# Patient Record
Sex: Male | Born: 1935 | Race: White | Hispanic: No | Smoking: Former smoker
Health system: Southern US, Community
[De-identification: ages and names within clinical notes are randomized; demographics above are authoritative.]

## PROBLEM LIST (undated history)

## (undated) DIAGNOSIS — J189 Pneumonia, unspecified organism: Secondary | ICD-10-CM

## (undated) DIAGNOSIS — H9190 Unspecified hearing loss, unspecified ear: Secondary | ICD-10-CM

## (undated) DIAGNOSIS — I4891 Unspecified atrial fibrillation: Secondary | ICD-10-CM

## (undated) DIAGNOSIS — K259 Gastric ulcer, unspecified as acute or chronic, without hemorrhage or perforation: Secondary | ICD-10-CM

## (undated) DIAGNOSIS — Z9981 Dependence on supplemental oxygen: Secondary | ICD-10-CM

## (undated) DIAGNOSIS — J439 Emphysema, unspecified: Secondary | ICD-10-CM

## (undated) DIAGNOSIS — H3530 Unspecified macular degeneration: Secondary | ICD-10-CM

## (undated) DIAGNOSIS — F0390 Unspecified dementia without behavioral disturbance: Secondary | ICD-10-CM

## (undated) DIAGNOSIS — J449 Chronic obstructive pulmonary disease, unspecified: Secondary | ICD-10-CM

## (undated) DIAGNOSIS — R296 Repeated falls: Secondary | ICD-10-CM

## (undated) DIAGNOSIS — N400 Enlarged prostate without lower urinary tract symptoms: Secondary | ICD-10-CM

## (undated) HISTORY — PX: CATARACT EXTRACTION W/ INTRAOCULAR LENS  IMPLANT, BILATERAL: SHX1307

## (undated) HISTORY — DX: Gastric ulcer, unspecified as acute or chronic, without hemorrhage or perforation: K25.9

## (undated) HISTORY — PX: COLONOSCOPY: SHX174

---

## 1999-08-28 ENCOUNTER — Ambulatory Visit (HOSPITAL_COMMUNITY): Admission: RE | Admit: 1999-08-28 | Discharge: 1999-08-28 | Payer: Self-pay | Admitting: Gastroenterology

## 2001-05-13 ENCOUNTER — Encounter: Admission: RE | Admit: 2001-05-13 | Discharge: 2001-08-11 | Payer: Self-pay | Admitting: Family Medicine

## 2001-08-31 ENCOUNTER — Ambulatory Visit (HOSPITAL_COMMUNITY): Admission: RE | Admit: 2001-08-31 | Discharge: 2001-08-31 | Payer: Self-pay | Admitting: Gastroenterology

## 2004-11-21 ENCOUNTER — Inpatient Hospital Stay (HOSPITAL_COMMUNITY): Admission: EM | Admit: 2004-11-21 | Discharge: 2004-11-24 | Payer: Self-pay | Admitting: Emergency Medicine

## 2012-12-25 ENCOUNTER — Ambulatory Visit (INDEPENDENT_AMBULATORY_CARE_PROVIDER_SITE_OTHER): Payer: Medicare Other | Admitting: Family Medicine

## 2012-12-25 VITALS — BP 102/68 | HR 87 | Temp 98.2°F | Resp 18 | Ht 65.0 in | Wt 132.0 lb

## 2012-12-25 DIAGNOSIS — H9190 Unspecified hearing loss, unspecified ear: Secondary | ICD-10-CM

## 2012-12-25 DIAGNOSIS — H9192 Unspecified hearing loss, left ear: Secondary | ICD-10-CM

## 2012-12-25 NOTE — Patient Instructions (Signed)
You can go to Dr Emeline Darling at [facility] on Monday at 1:00.

## 2012-12-25 NOTE — Progress Notes (Signed)
79 yo retired man with left sided loss of hearing, tinnitus, and occasional dizziness/vertigo x 6 months.  The vertigo after the tinnitus has been 3 months.  Tried cleaning left ear with Q-tip which helped x 2 days.  Symptoms have improved.  The "chirping" in his left ear is worse when lying on left ear.  Objective:  Left cerumen impaction Right canal and TM are normal Decreased hearing acuity. Alert with good eye contact. 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

## 2016-10-14 HISTORY — PX: INGUINAL HERNIA REPAIR: SUR1180

## 2016-12-12 DIAGNOSIS — R296 Repeated falls: Secondary | ICD-10-CM

## 2016-12-12 HISTORY — DX: Repeated falls: R29.6

## 2016-12-15 ENCOUNTER — Encounter (HOSPITAL_BASED_OUTPATIENT_CLINIC_OR_DEPARTMENT_OTHER): Payer: Self-pay | Admitting: Emergency Medicine

## 2016-12-15 ENCOUNTER — Inpatient Hospital Stay (HOSPITAL_BASED_OUTPATIENT_CLINIC_OR_DEPARTMENT_OTHER)
Admission: EM | Admit: 2016-12-15 | Discharge: 2016-12-20 | DRG: 872 | Disposition: A | Discharge status: Skilled Nursing Facility | Payer: Medicare Other | Attending: Internal Medicine | Admitting: Internal Medicine

## 2016-12-15 ENCOUNTER — Emergency Department (HOSPITAL_BASED_OUTPATIENT_CLINIC_OR_DEPARTMENT_OTHER): Payer: Medicare Other

## 2016-12-15 DIAGNOSIS — Z79899 Other long term (current) drug therapy: Secondary | ICD-10-CM | POA: Diagnosis not present

## 2016-12-15 DIAGNOSIS — Z515 Encounter for palliative care: Secondary | ICD-10-CM | POA: Diagnosis not present

## 2016-12-15 DIAGNOSIS — F0390 Unspecified dementia without behavioral disturbance: Secondary | ICD-10-CM | POA: Diagnosis present

## 2016-12-15 DIAGNOSIS — M25551 Pain in right hip: Secondary | ICD-10-CM | POA: Diagnosis present

## 2016-12-15 DIAGNOSIS — I10 Essential (primary) hypertension: Secondary | ICD-10-CM | POA: Diagnosis present

## 2016-12-15 DIAGNOSIS — A4151 Sepsis due to Escherichia coli [E. coli]: Secondary | ICD-10-CM | POA: Diagnosis present

## 2016-12-15 DIAGNOSIS — E860 Dehydration: Secondary | ICD-10-CM | POA: Diagnosis present

## 2016-12-15 DIAGNOSIS — A498 Other bacterial infections of unspecified site: Secondary | ICD-10-CM | POA: Diagnosis not present

## 2016-12-15 DIAGNOSIS — R778 Other specified abnormalities of plasma proteins: Secondary | ICD-10-CM | POA: Diagnosis present

## 2016-12-15 DIAGNOSIS — I951 Orthostatic hypotension: Secondary | ICD-10-CM | POA: Diagnosis not present

## 2016-12-15 DIAGNOSIS — T796XXA Traumatic ischemia of muscle, initial encounter: Secondary | ICD-10-CM | POA: Diagnosis present

## 2016-12-15 DIAGNOSIS — M25552 Pain in left hip: Secondary | ICD-10-CM | POA: Diagnosis present

## 2016-12-15 DIAGNOSIS — H9190 Unspecified hearing loss, unspecified ear: Secondary | ICD-10-CM | POA: Diagnosis present

## 2016-12-15 DIAGNOSIS — N400 Enlarged prostate without lower urinary tract symptoms: Secondary | ICD-10-CM | POA: Diagnosis present

## 2016-12-15 DIAGNOSIS — D696 Thrombocytopenia, unspecified: Secondary | ICD-10-CM | POA: Diagnosis present

## 2016-12-15 DIAGNOSIS — D649 Anemia, unspecified: Secondary | ICD-10-CM | POA: Diagnosis present

## 2016-12-15 DIAGNOSIS — R0600 Dyspnea, unspecified: Secondary | ICD-10-CM | POA: Diagnosis not present

## 2016-12-15 DIAGNOSIS — I959 Hypotension, unspecified: Secondary | ICD-10-CM | POA: Diagnosis present

## 2016-12-15 DIAGNOSIS — W1830XA Fall on same level, unspecified, initial encounter: Secondary | ICD-10-CM | POA: Diagnosis present

## 2016-12-15 DIAGNOSIS — N179 Acute kidney failure, unspecified: Secondary | ICD-10-CM | POA: Diagnosis present

## 2016-12-15 DIAGNOSIS — J449 Chronic obstructive pulmonary disease, unspecified: Secondary | ICD-10-CM | POA: Diagnosis present

## 2016-12-15 DIAGNOSIS — I4891 Unspecified atrial fibrillation: Secondary | ICD-10-CM | POA: Diagnosis present

## 2016-12-15 DIAGNOSIS — G3109 Other frontotemporal dementia: Secondary | ICD-10-CM

## 2016-12-15 DIAGNOSIS — R64 Cachexia: Secondary | ICD-10-CM | POA: Diagnosis present

## 2016-12-15 DIAGNOSIS — A419 Sepsis, unspecified organism: Secondary | ICD-10-CM | POA: Diagnosis not present

## 2016-12-15 DIAGNOSIS — K219 Gastro-esophageal reflux disease without esophagitis: Secondary | ICD-10-CM | POA: Diagnosis present

## 2016-12-15 DIAGNOSIS — H548 Legal blindness, as defined in USA: Secondary | ICD-10-CM | POA: Diagnosis present

## 2016-12-15 DIAGNOSIS — Z8711 Personal history of peptic ulcer disease: Secondary | ICD-10-CM

## 2016-12-15 DIAGNOSIS — F0280 Dementia in other diseases classified elsewhere without behavioral disturbance: Secondary | ICD-10-CM | POA: Diagnosis present

## 2016-12-15 DIAGNOSIS — E46 Unspecified protein-calorie malnutrition: Secondary | ICD-10-CM | POA: Diagnosis present

## 2016-12-15 DIAGNOSIS — Z800 Family history of malignant neoplasm of digestive organs: Secondary | ICD-10-CM

## 2016-12-15 DIAGNOSIS — Z66 Do not resuscitate: Secondary | ICD-10-CM | POA: Diagnosis present

## 2016-12-15 DIAGNOSIS — I9589 Other hypotension: Secondary | ICD-10-CM | POA: Diagnosis present

## 2016-12-15 DIAGNOSIS — Y92009 Unspecified place in unspecified non-institutional (private) residence as the place of occurrence of the external cause: Secondary | ICD-10-CM | POA: Diagnosis not present

## 2016-12-15 DIAGNOSIS — Z823 Family history of stroke: Secondary | ICD-10-CM

## 2016-12-15 DIAGNOSIS — E876 Hypokalemia: Secondary | ICD-10-CM | POA: Diagnosis present

## 2016-12-15 DIAGNOSIS — Z6820 Body mass index (BMI) 20.0-20.9, adult: Secondary | ICD-10-CM | POA: Diagnosis not present

## 2016-12-15 DIAGNOSIS — Z7189 Other specified counseling: Secondary | ICD-10-CM | POA: Diagnosis not present

## 2016-12-15 DIAGNOSIS — I714 Abdominal aortic aneurysm, without rupture: Secondary | ICD-10-CM | POA: Diagnosis present

## 2016-12-15 DIAGNOSIS — Z7982 Long term (current) use of aspirin: Secondary | ICD-10-CM | POA: Diagnosis not present

## 2016-12-15 DIAGNOSIS — Z8249 Family history of ischemic heart disease and other diseases of the circulatory system: Secondary | ICD-10-CM

## 2016-12-15 DIAGNOSIS — W19XXXD Unspecified fall, subsequent encounter: Secondary | ICD-10-CM | POA: Diagnosis not present

## 2016-12-15 DIAGNOSIS — Z8042 Family history of malignant neoplasm of prostate: Secondary | ICD-10-CM

## 2016-12-15 DIAGNOSIS — Z83511 Family history of glaucoma: Secondary | ICD-10-CM

## 2016-12-15 DIAGNOSIS — R7881 Bacteremia: Secondary | ICD-10-CM | POA: Diagnosis not present

## 2016-12-15 DIAGNOSIS — W19XXXA Unspecified fall, initial encounter: Secondary | ICD-10-CM | POA: Diagnosis not present

## 2016-12-15 DIAGNOSIS — R740 Nonspecific elevation of levels of transaminase and lactic acid dehydrogenase [LDH]: Secondary | ICD-10-CM | POA: Diagnosis present

## 2016-12-15 HISTORY — DX: Chronic obstructive pulmonary disease, unspecified: J44.9

## 2016-12-15 LAB — URINALYSIS, ROUTINE W REFLEX MICROSCOPIC
GLUCOSE, UA: NEGATIVE mg/dL
KETONES UR: NEGATIVE mg/dL
Leukocytes, UA: NEGATIVE
Nitrite: NEGATIVE
PH: 5 (ref 5.0–8.0)
Protein, ur: 100 mg/dL — AB
Specific Gravity, Urine: 1.019 (ref 1.005–1.030)

## 2016-12-15 LAB — I-STAT CG4 LACTIC ACID, ED
LACTIC ACID, VENOUS: 1.18 mmol/L (ref 0.5–1.9)
Lactic Acid, Venous: 2.1 mmol/L (ref 0.5–1.9)

## 2016-12-15 LAB — BASIC METABOLIC PANEL WITH GFR
Anion gap: 14 (ref 5–15)
BUN: 86 mg/dL — ABNORMAL HIGH (ref 6–20)
CO2: 19 mmol/L — ABNORMAL LOW (ref 22–32)
Calcium: 8.6 mg/dL — ABNORMAL LOW (ref 8.9–10.3)
Chloride: 108 mmol/L (ref 101–111)
Creatinine, Ser: 2.64 mg/dL — ABNORMAL HIGH (ref 0.61–1.24)
GFR calc Af Amer: 22 mL/min — ABNORMAL LOW
GFR calc non Af Amer: 19 mL/min — ABNORMAL LOW
Glucose, Bld: 98 mg/dL (ref 65–99)
Potassium: 4.1 mmol/L (ref 3.5–5.1)
Sodium: 141 mmol/L (ref 135–145)

## 2016-12-15 LAB — URINALYSIS, MICROSCOPIC (REFLEX)

## 2016-12-15 LAB — CBC
HEMATOCRIT: 34.8 % — AB (ref 39.0–52.0)
Hemoglobin: 11.8 g/dL — ABNORMAL LOW (ref 13.0–17.0)
MCH: 31.2 pg (ref 26.0–34.0)
MCHC: 33.9 g/dL (ref 30.0–36.0)
MCV: 92.1 fL (ref 78.0–100.0)
Platelets: 64 10*3/uL — ABNORMAL LOW (ref 150–400)
RBC: 3.78 MIL/uL — ABNORMAL LOW (ref 4.22–5.81)
RDW: 13.9 % (ref 11.5–15.5)
WBC: 8.7 10*3/uL (ref 4.0–10.5)

## 2016-12-15 LAB — CK: Total CK: 539 U/L — ABNORMAL HIGH (ref 49–397)

## 2016-12-15 MED ORDER — PIPERACILLIN-TAZOBACTAM 3.375 G IVPB 30 MIN
3.3750 g | Freq: Once | INTRAVENOUS | Status: AC
  Administered 2016-12-15: 3.375 g via INTRAVENOUS
  Filled 2016-12-15 (×2): qty 50

## 2016-12-15 MED ORDER — SODIUM CHLORIDE 0.9 % IV BOLUS (SEPSIS)
1000.0000 mL | Freq: Once | INTRAVENOUS | Status: AC
  Administered 2016-12-15: 1000 mL via INTRAVENOUS

## 2016-12-15 MED ORDER — METOPROLOL TARTRATE 5 MG/5ML IV SOLN
5.0000 mg | Freq: Once | INTRAVENOUS | Status: AC
  Administered 2016-12-15: 5 mg via INTRAVENOUS
  Filled 2016-12-15: qty 5

## 2016-12-15 MED ORDER — DILTIAZEM HCL 100 MG IV SOLR
INTRAVENOUS | Status: AC
  Administered 2016-12-15: 5 mg/h
  Filled 2016-12-15: qty 100

## 2016-12-15 MED ORDER — DILTIAZEM HCL-DEXTROSE 100-5 MG/100ML-% IV SOLN (PREMIX)
5.0000 mg/h | INTRAVENOUS | Status: DC
  Filled 2016-12-15: qty 100

## 2016-12-15 MED ORDER — DILTIAZEM HCL 25 MG/5ML IV SOLN
10.0000 mg | Freq: Once | INTRAVENOUS | Status: AC
  Administered 2016-12-15: 10 mg via INTRAVENOUS
  Filled 2016-12-15: qty 5

## 2016-12-15 MED ORDER — PIPERACILLIN-TAZOBACTAM IN DEX 2-0.25 GM/50ML IV SOLN
2.2500 g | Freq: Three times a day (TID) | INTRAVENOUS | Status: DC
  Administered 2016-12-16 – 2016-12-17 (×4): 2.25 g via INTRAVENOUS
  Filled 2016-12-15 (×8): qty 50

## 2016-12-15 MED ORDER — SODIUM CHLORIDE 0.9 % IV SOLN
INTRAVENOUS | Status: AC
  Administered 2016-12-15: 21:00:00 via INTRAVENOUS

## 2016-12-15 MED ORDER — VANCOMYCIN HCL IN DEXTROSE 750-5 MG/150ML-% IV SOLN
750.0000 mg | INTRAVENOUS | Status: DC
  Filled 2016-12-15: qty 150

## 2016-12-15 MED ORDER — VANCOMYCIN HCL IN DEXTROSE 1-5 GM/200ML-% IV SOLN
1000.0000 mg | Freq: Once | INTRAVENOUS | Status: AC
  Administered 2016-12-15: 1000 mg via INTRAVENOUS
  Filled 2016-12-15: qty 200

## 2016-12-15 NOTE — ED Notes (Addendum)
Report received, care assumed. Sleeping, NAD, calm, resps e/u, no dyspnea noted, skin W&D, VSS/ BP remains low, NSR HR 88 on the monitor[facility] and vanc infusing. Pending bed assignment.

## 2016-12-15 NOTE — ED Notes (Signed)
Alert, NAD, calm, interactive, resps e/u, speaking in clear complete sentences without teeth, no dyspnea noted, skin W&D, VSS, BP remains low, NSR HR 77, [facility] for [facility].

## 2016-12-15 NOTE — ED Notes (Signed)
[facility] leaving with pt now, report called to Jasmine DecemberSharon, RN [facility] [phone]

## 2016-12-15 NOTE — Progress Notes (Signed)
[facility] lack of step[facility] beds Travis Bradley is a 83 year old male with past medical history significant for COPD and stomach ulcer; who presented after being found down for some unknown period in time. Patient's daughter noted to be unable to contact them for last 2 days.   VS: Temp afebrile, pulse 99-168(intermittent A. fib with RVR), respirations 16-22, pressure as low as 73/49 intermittently responsive to IV fluids.  Labs: WBC 8.7, hemoglobin 11.8, platelets 64, BUN 86, creatinine 2.64, and lactic acid 2.1, trending down to 1.18. CXR showing chronic interstitial changes. UA showing many bacteria, many RBCs, but negative for any other signs of infection.   Patient placed on empiric abx vancomycin and Zosyn to be transferred to a stepdown bed.  Patient  received 3 L of IV fluids so far. Patient is a DO NOT RESUSCITATE.

## 2016-12-15 NOTE — Progress Notes (Signed)
Pharmacy Antibiotic Note  Travis Bradley is a 83 y.o. male admitted on 12/15/2016 with sepsis.  Pharmacy has been consulted for vancomycin and Zosyn dosing.  Vancomycin 1g IV x1 and Zosyn 3.375g IV x1 [facility]. SCr elevated (baseline appears to [facility]  Plan: Vancomycin 750 IV every 48 hours.  Goal trough 15-20 mcg/mL. Zosyn 2.25g IV q8h Follow c/s, clinical progression, renal function, level PRN  Height: 5\' 5"  (165.1 cm) Weight: 125 lb (56.7 kg) IBW/kg (Calculated) : 61.5  Temp (24hrs), Avg:97.8 F (36.6 C), Min:97.6 F (36.4 C), Max:97.9 F (36.6 C)   Recent Labs Lab 12/15/16 1712 12/15/16 1812  WBC 8.7  --   CREATININE 2.64*  --   LATICACIDVEN  --  2.10*    Estimated Creatinine Clearance: 13.7 mL/min (by C-G formula based on SCr of 2.64 mg/dL (H)).    No Known Allergies  Antimicrobials this admission: vancomycin 3/4 >>  zosyn 3/4 >>   Dose adjustments this admission: n/a  Microbiology results: 3/4 BCx:  3/4 UCx:    Jessina Marse D. Tai Skelly, PharmD, BCPS Clinical Pharmacist Pager: [phone] 12/15/2016 8:33 PM

## 2016-12-15 NOTE — ED Notes (Signed)
ED Provider at bedside. 

## 2016-12-15 NOTE — ED Notes (Signed)
Pt reports he has been vomiting since last week.

## 2016-12-15 NOTE — ED Triage Notes (Addendum)
Pt found in floor beside bed today. Pt states he slid to the floor when he tried to get up. Pt daughter states she has been trying to reach him on the phone since yesterday and was unable to get him. Pt was possibly laying on floor since yesterday. saline give by EMS enroute. Pt c/o generalized weakness and R hip pain. Pt is wearing dirty clothes and looks disheveled.

## 2016-12-15 NOTE — ED Notes (Signed)
Attempted report 

## 2016-12-15 NOTE — ED Notes (Signed)
Family updated. Per H&P: pt is DNR status, no intubation, no cpr.

## 2016-12-15 NOTE — ED Provider Notes (Addendum)
[facility]Y [facility] Provider Note   CSN: [id number] Arrival date & time: 12/15/16  1651   By signing my name below, I, Clarisse Gouge, attest that this documentation has been prepared under the direction and in the presence of Cathren Laine, MD. Electronically signed, Clarisse Gouge, ED Scribe. 12/15/16. 5:45 PM.   History   Chief Complaint Chief Complaint  Patient presents with  . Fall   The history is provided by the patient, a relative and medical records. No language interpreter was used.    LEVEL 5 CAVEAT DUE TO AMS  HPI Comments: Travis Bradley is a 83 y.o. male who presents to the Emergency Department following an unwitnessed fall that occurred today. He currently c/o back and pain. Pt lives at home alone. His daughter states she has been unable to contact him for 2 days. She notes associated tremors, and she notes the pt reports recent vomiting. Pt adds recent appetite change; he states he has forced himself to eat over the past 2-3 days. Daughter notes recent, progressive memory loss. Hx of a hernia procedure performed ~1 week ago at [facility] hospital. Daughter also notes the pt is legally blind, deaf and incapable of driving motor vehicles.  Pt very limited historian, ?dementia - level 5 caveat.    Past Medical History:  Diagnosis Date  . COPD (chronic obstructive pulmonary disease) (HCC)   . Stomach ulcer   . Stomach ulcer     There are no active problems to display for this patient.   Past Surgical History:  Procedure Laterality Date  . HERNIA REPAIR         Home Medications    Prior to Admission medications   Medication Sig Start Date End Date Taking? Authorizing Provider  albuterol (PROAIR HFA) 108 (90 BASE) MCG/ACT inhaler Inhale 2 puffs into the lungs every 6 (six) hours as needed for wheezing.   Yes Historical Provider, MD  aspirin 325 MG tablet Take 325 mg by mouth daily.   Yes Historical Provider, MD  fish oil-omega-3 fatty acids 1000 MG  capsule Take 2 g by mouth daily.    Historical Provider, MD  multivitamin-iron-minerals-folic acid (CENTRUM) chewable tablet Chew 1 tablet by mouth daily.    Historical Provider, MD  omeprazole (PRILOSEC) 10 MG capsule Take 10 mg by mouth daily.    Historical Provider, MD  pseudoephedrine-guaifenesin (MUCINEX D) 60-600 MG per tablet Take 1 tablet by mouth every 12 (twelve) hours.    Historical Provider, MD    Family History No family history on file.  Social History Social History  Substance Use Topics  . Smoking status: Never Smoker  . Smokeless tobacco: Never Used  . Alcohol use No     Allergies   Patient has no known allergies.   Review of Systems Review of Systems  Unable to perform ROS: Dementia  Constitutional: Positive for appetite change. Negative for chills and fever.  Gastrointestinal: Positive for nausea and vomiting.  Musculoskeletal: Positive for arthralgias and back pain.  Neurological: Positive for tremors.  level 5 caveat -  Poorly responsive, ?dementia   Physical Exam Updated Vital Signs BP 102/57   Pulse 115   Temp 97.6 F (36.4 C) (Oral)   Resp 22   SpO2 96%   Physical Exam  Constitutional:  Elderly, frail, cachectic appearing.   HENT:  Head: Normocephalic and atraumatic.  Mouth/Throat: Mucous membranes are dry.  Scab forehead (from prior mole removal).  Dry mucous membranes.   Eyes: Conjunctivae and EOM  are normal. Pupils are equal, round, and reactive to light. No scleral icterus.  Neck: Normal range of motion. Neck supple. No JVD present. No tracheal deviation present. No thyromegaly present.  No stiffness or rigidity  Cardiovascular: Regular rhythm, normal heart sounds and intact distal pulses.  Tachycardia present.  Exam reveals no gallop and no friction rub.   No murmur heard. Tachycardic.   Pulmonary/Chest: Effort normal and breath sounds normal. No respiratory distress. He has no wheezes.  Abdominal: Soft. Bowel sounds are normal. He  exhibits no distension and no mass. There is no tenderness. There is no rebound and no guarding.  Hernia surgery scar right, without sign of infection  Genitourinary:  Genitourinary Comments: No cva tenderness  Musculoskeletal: He exhibits no edema.  Mild pain with ROM of the left hip. Distal pulses palp.   Neurological: He is alert.  Very hoh.  Visually impaired. Moves bil extremities purposefully with good strength.   Skin: No rash noted. No pallor.  Psychiatric: He has a normal mood and affect.  Nursing note and vitals reviewed.    ED Treatments / Results  DIAGNOSTIC STUDIES: Oxygen Saturation is 96% on RA, adequate by my interpretation.    COORDINATION OF CARE: 5:43 PM Discussed treatment plan with pt at bedside and pt agreed to plan. Will order fluids and labs.  Labs (all labs ordered are listed, but only abnormal results are displayed) Results for orders placed or performed during the hospital encounter of 12/15/16  Basic metabolic panel  Result Value Ref Range   Sodium 141 135 - 145 mmol/L   Potassium 4.1 3.5 - 5.1 mmol/L   Chloride 108 101 - 111 mmol/L   CO2 19 (L) 22 - 32 mmol/L   Glucose, Bld 98 65 - 99 mg/dL   BUN 86 (H) 6 - 20 mg/dL   Creatinine, Ser 4.092.64 (H) 0.61 - 1.24 mg/dL   Calcium 8.6 (L) 8.9 - 10.3 mg/dL   GFR calc non Af Amer 19 (L) >60 mL/min   GFR calc Af Amer 22 (L) >60 mL/min   Anion gap 14 5 - 15  CBC  Result Value Ref Range   WBC 8.7 4.0 - 10.5 K/uL   RBC 3.78 (L) 4.22 - 5.81 MIL/uL   Hemoglobin 11.8 (L) 13.0 - 17.0 g/dL   HCT 81.134.8 (L) 91.439.0 - 78.252.0 %   MCV 92.1 78.0 - 100.0 fL   MCH 31.2 26.0 - 34.0 pg   MCHC 33.9 30.0 - 36.0 g/dL   RDW 95.613.9 21.311.5 - 08.615.5 %   Platelets 64 (L) 150 - 400 K/uL  Urinalysis, Routine w reflex microscopic  Result Value Ref Range   Color, Urine AMBER (A) YELLOW   APPearance CLOUDY (A) CLEAR   Specific Gravity, Urine 1.019 1.005 - 1.030   pH 5.0 5.0 - 8.0   Glucose, UA NEGATIVE NEGATIVE mg/dL   Hgb urine dipstick  LARGE (A) NEGATIVE   Bilirubin Urine SMALL (A) NEGATIVE   Ketones, ur NEGATIVE NEGATIVE mg/dL   Protein, ur 578100 (A) NEGATIVE mg/dL   Nitrite NEGATIVE NEGATIVE   Leukocytes, UA NEGATIVE NEGATIVE  CK  Result Value Ref Range   Total CK 539 (H) 49 - 397 U/L  Urinalysis, Microscopic (reflex)  Result Value Ref Range   RBC / HPF 6-30 0 - 5 RBC/hpf   WBC, UA 0-5 0 - 5 WBC/hpf   Bacteria, UA MANY (A) NONE SEEN   Squamous Epithelial / LPF 0-5 (A) NONE SEEN   Granular  Casts, UA PRESENT   I-Stat CG4 Lactic Acid, ED  (not at  [facility])  Result Value Ref Range   Lactic Acid, Venous 2.10 (HH) 0.5 - 1.9 mmol/L   Comment NOTIFIED PHYSICIAN     EKG  EKG Interpretation  Date/Time:  Sunday December 15 2016 17:06:33 EST Ventricular Rate:  122 PR Interval:    QRS Duration: 70 QT Interval:  303 QTC Calculation: 432 R Axis:   48 Text Interpretation:  Sinus tachycardia No significant change since last tracing Confirmed by Denton Lank  MD, Caryn Bee ([id number]) on 12/15/2016 5:09:57 PM       Radiology Dg Chest Port 1 View  Result Date: 12/15/2016 CLINICAL DATA:  Possible fall, left hip pain. EXAM: PORTABLE CHEST 1 VIEW COMPARISON:  None. FINDINGS: Heart size and mediastinal contours are within normal limits. Atherosclerotic changes noted at the aortic arch. Coarse interstitial markings are seen throughout both lungs, of uncertain chronicity, interstitial edema versus chronic interstitial lung disease. Suspect chronic bronchitic changes centrally. No confluent airspace opacity. No pleural effusion or pneumothorax seen. No osseous fracture or dislocation seen. IMPRESSION: 1. Coarse interstitial lung markings throughout both lungs, of uncertain chronicity, interstitial edema versus chronic interstitial lung disease. 2. Suspect chronic bronchitic changes. 3. No evidence of consolidating pneumonia. 4. Aortic atherosclerosis. Electronically Signed   By: Bary Richard M.D.   On: 12/15/2016 20:25   Dg Hip Unilat W Or W/o Pelvis  2-3 Views Left  Result Date: 12/15/2016 CLINICAL DATA:  Left hip pain after possible fall. EXAM: DG HIP (WITH OR WITHOUT PELVIS) 2-3V LEFT COMPARISON:  None. FINDINGS: Femoral heads are located. Sacroiliac joints are symmetric. No acute fracture. Vascular calcifications. IMPRESSION: No acute osseous abnormality. Electronically Signed   By: Jeronimo Greaves M.D.   On: 12/15/2016 20:25    Procedures Procedures (including critical care time)  Medications Ordered in ED Medications  sodium chloride 0.9 % bolus 1,000 mL (not administered)    And  sodium chloride 0.9 % bolus 1,000 mL (not administered)     Initial Impression / Assessment and Plan / ED Course  I have reviewed the triage vital signs and the nursing notes.  Pertinent labs & imaging results that were available during my care of the patient were reviewed by me and considered in my medical decision making (see chart for details).  I personally performed the services described in this documentation, which was scribed in my presence. The recorded information has been reviewed and considered. Cathren Laine, MD  Iv ns bolus. Continuous pulse ox and monitor. Ecg. Stat labs and xrays.  Periods of rapid afib, hr 180. cardizem bolus and gtt.  bp is low in ED, 30 cc/kg bolus. cardizem held.  Recheck in sinus rhythm, rate 100.    Blood and urine cultures.   Iv abx.   Pt with intermittent afib, when in afib hr 160-190.   Currently in sinus rhythm, hr 100.   Patient prefers to go to [facility] consulted for admission.  CRITICAL CARE  RE:  Hypotension, AKI, new onset rapid atrial fibrillation, dehydration, severe,  Performed by: Suzi Roots Total critical care time: 45 minutes Critical care time was exclusive of separately billable procedures and treating other patients. Critical care was necessary to treat or prevent imminent or life-threatening deterioration. Critical care was time spent personally by me on  the following activities: development of treatment plan with patient and/or surrogate as well as nursing, discussions with consultants, evaluation of patient's response to treatment, examination  of patient, obtaining history from patient or surrogate, ordering and performing treatments and interventions, ordering and review of laboratory studies, ordering and review of radiographic studies, pulse oximetry and re-evaluation of patient's condition.  Discussed pt with Dr Smith/[facility] Hospitalist who accepts to stepdown bed at [facility].  On discussions w pt/family, pt is DNR status, no intubation, no cpr.   Recheck, pt alert, appears comfortable. bp 92/55. Pt requires transfer to stepdown unit for ongoing care.      Final Clinical Impressions(s) / ED Diagnoses   Final diagnoses:  None    New Prescriptions New Prescriptions   No medications on file            Cathren Laine, MD 12/15/16 9604    Cathren Laine, MD 12/15/16 2300

## 2016-12-16 DIAGNOSIS — I4891 Unspecified atrial fibrillation: Secondary | ICD-10-CM

## 2016-12-16 DIAGNOSIS — W19XXXA Unspecified fall, initial encounter: Secondary | ICD-10-CM

## 2016-12-16 DIAGNOSIS — F0390 Unspecified dementia without behavioral disturbance: Secondary | ICD-10-CM

## 2016-12-16 LAB — BLOOD CULTURE ID PANEL (REFLEXED)
Acinetobacter baumannii: NOT DETECTED
CANDIDA PARAPSILOSIS: NOT DETECTED
CARBAPENEM RESISTANCE: NOT DETECTED
Candida albicans: NOT DETECTED
Candida glabrata: NOT DETECTED
Candida krusei: NOT DETECTED
Candida tropicalis: NOT DETECTED
ENTEROBACTERIACEAE SPECIES: DETECTED — AB
ENTEROCOCCUS SPECIES: NOT DETECTED
Enterobacter cloacae complex: NOT DETECTED
Escherichia coli: DETECTED — AB
HAEMOPHILUS INFLUENZAE: NOT DETECTED
Klebsiella oxytoca: NOT DETECTED
Klebsiella pneumoniae: NOT DETECTED
LISTERIA MONOCYTOGENES: NOT DETECTED
NEISSERIA MENINGITIDIS: NOT DETECTED
PSEUDOMONAS AERUGINOSA: NOT DETECTED
Proteus species: NOT DETECTED
STAPHYLOCOCCUS AUREUS BCID: NOT DETECTED
STREPTOCOCCUS PNEUMONIAE: NOT DETECTED
STREPTOCOCCUS PYOGENES: NOT DETECTED
Serratia marcescens: NOT DETECTED
Staphylococcus species: NOT DETECTED
Streptococcus agalactiae: NOT DETECTED
Streptococcus species: NOT DETECTED

## 2016-12-16 LAB — CBC
HCT: 30.8 % — ABNORMAL LOW (ref 39.0–52.0)
HEMOGLOBIN: 10.2 g/dL — AB (ref 13.0–17.0)
MCH: 30.4 pg (ref 26.0–34.0)
MCHC: 33.1 g/dL (ref 30.0–36.0)
MCV: 91.7 fL (ref 78.0–100.0)
PLATELETS: 62 10*3/uL — AB (ref 150–400)
RBC: 3.36 MIL/uL — AB (ref 4.22–5.81)
RDW: 14.2 % (ref 11.5–15.5)
WBC: 21.3 10*3/uL — ABNORMAL HIGH (ref 4.0–10.5)

## 2016-12-16 LAB — COMPREHENSIVE METABOLIC PANEL
ALBUMIN: 2.1 g/dL — AB (ref 3.5–5.0)
ALK PHOS: 136 U/L — AB (ref 38–126)
ALT: 89 U/L — ABNORMAL HIGH (ref 17–63)
ANION GAP: 9 (ref 5–15)
AST: 58 U/L — ABNORMAL HIGH (ref 15–41)
BUN: 71 mg/dL — ABNORMAL HIGH (ref 6–20)
CALCIUM: 7.6 mg/dL — AB (ref 8.9–10.3)
CHLORIDE: 114 mmol/L — AB (ref 101–111)
CO2: 19 mmol/L — AB (ref 22–32)
Creatinine, Ser: 2.21 mg/dL — ABNORMAL HIGH (ref 0.61–1.24)
GFR calc non Af Amer: 24 mL/min — ABNORMAL LOW (ref >60)
GFR, EST AFRICAN AMERICAN: 28 mL/min — AB (ref >60)
Glucose, Bld: 114 mg/dL — ABNORMAL HIGH (ref 65–99)
Potassium: 3.7 mmol/L (ref 3.5–5.1)
SODIUM: 142 mmol/L (ref 135–145)
Total Bilirubin: 1.7 mg/dL — ABNORMAL HIGH (ref 0.3–1.2)
Total Protein: 5.5 g/dL — ABNORMAL LOW (ref 6.5–8.1)

## 2016-12-16 LAB — TROPONIN I
TROPONIN I: 0.14 ng/mL — AB (ref <0.03)
TROPONIN I: 0.09 ng/mL — AB (ref <0.03)
TROPONIN I: 0.08 ng/mL — AB (ref <0.03)

## 2016-12-16 LAB — PHOSPHORUS: PHOSPHORUS: 3.6 mg/dL (ref 2.5–4.6)

## 2016-12-16 LAB — CK: Total CK: 363 U/L (ref 49–397)

## 2016-12-16 LAB — MRSA PCR SCREENING: MRSA BY PCR: NEGATIVE

## 2016-12-16 LAB — MAGNESIUM: Magnesium: 2 mg/dL (ref 1.7–2.4)

## 2016-12-16 LAB — TSH: TSH: 0.914 u[IU]/mL (ref 0.350–4.500)

## 2016-12-16 LAB — GLUCOSE, CAPILLARY: Glucose-Capillary: 127 mg/dL — ABNORMAL HIGH (ref 65–99)

## 2016-12-16 MED ORDER — ASPIRIN 325 MG PO TABS
325.0000 mg | ORAL_TABLET | Freq: Every day | ORAL | Status: DC
  Administered 2016-12-16 – 2016-12-20 (×5): 325 mg via ORAL
  Filled 2016-12-16 (×5): qty 1

## 2016-12-16 MED ORDER — ALBUTEROL SULFATE (2.5 MG/3ML) 0.083% IN NEBU: 2.5000 mg | INHALATION_SOLUTION | Freq: Four times a day (QID) | RESPIRATORY_TRACT | Status: DC | PRN

## 2016-12-16 MED ORDER — DOCUSATE SODIUM 100 MG PO CAPS
100.0000 mg | ORAL_CAPSULE | Freq: Two times a day (BID) | ORAL | Status: DC
  Administered 2016-12-16 – 2016-12-20 (×9): 100 mg via ORAL
  Filled 2016-12-16 (×9): qty 1

## 2016-12-16 MED ORDER — GUAIFENESIN ER 600 MG PO TB12
600.0000 mg | ORAL_TABLET | Freq: Two times a day (BID) | ORAL | Status: DC
  Administered 2016-12-16 – 2016-12-20 (×9): 600 mg via ORAL
  Filled 2016-12-16 (×9): qty 1

## 2016-12-16 MED ORDER — HYDROCODONE-ACETAMINOPHEN 5-325 MG PO TABS
1.0000 | ORAL_TABLET | Freq: Four times a day (QID) | ORAL | Status: DC | PRN
  Administered 2016-12-16 – 2016-12-20 (×5): 1 via ORAL
  Filled 2016-12-16 (×5): qty 1

## 2016-12-16 MED ORDER — PANTOPRAZOLE SODIUM 40 MG PO TBEC
40.0000 mg | DELAYED_RELEASE_TABLET | Freq: Every day | ORAL | Status: DC
Start: 2016-12-16 — End: 2016-12-20
  Administered 2016-12-16 – 2016-12-20 (×5): 40 mg via ORAL
  Filled 2016-12-16 (×5): qty 1

## 2016-12-16 MED ORDER — ADULT MULTIVITAMIN W/MINERALS CH
1.0000 | ORAL_TABLET | Freq: Every day | ORAL | Status: DC
Start: 2016-12-16 — End: 2016-12-20
  Administered 2016-12-16 – 2016-12-20 (×5): 1 via ORAL
  Filled 2016-12-16 (×5): qty 1

## 2016-12-16 MED ORDER — SODIUM CHLORIDE 0.9 % IV SOLN
INTRAVENOUS | Status: AC
  Administered 2016-12-16: 11:00:00 via INTRAVENOUS

## 2016-12-16 MED ORDER — OMEGA-3-ACID ETHYL ESTERS 1 G PO CAPS
2.0000 g | ORAL_CAPSULE | Freq: Every day | ORAL | Status: DC
  Administered 2016-12-16 – 2016-12-20 (×5): 2 g via ORAL
  Filled 2016-12-16 (×5): qty 2

## 2016-12-16 MED ORDER — HEPARIN SODIUM (PORCINE) 5000 UNIT/ML IJ SOLN: 5000.0000 [IU] | Freq: Three times a day (TID) | INTRAMUSCULAR | Status: DC

## 2016-12-16 NOTE — Progress Notes (Signed)
Pt seen and examined at bedside this AM. Pt was admitted after midnight, please see earlier admission note by Dr. Nelson ChimesAmin. Pt admitted after an episode of fall at home and upon arrival to the ED was found to be confused, hypotensive, in a-fib with RVR. Due to concern of sepsis, pt was started on Vanc and Zosyn. Today, preliminary blood culture with g- rods. Will stop vancomycin. Keep in SDU. Pt still confused, may need sitter. CBC and BMP in AM. Pt is DNR.   Debbora PrestoMAGICK-Tabetha Haraway, MD  [facility] Pager [phone]  If 7PM-7AM, please contact night-coverage [email or web address] Password [email or web address]

## 2016-12-16 NOTE — NC FL2 (Signed)
[place] MEDICAID FL2 LEVEL OF CARE SCREENING TOOL     IDENTIFICATION  [name] Birthdate: 07/01/1920 Sex: male Admission Date (Current Location): 12/15/2016  [facility] [place]   Facility and Address:  [facility] [place], [address], [place], [place] [address]      Provider Number: [id number]  Attending Physician Name and Address:  Dorothea OgleIskra M Myers, MD  Relative Name and Phone Number:       Current Level of Care: Hospital Recommended Level of Care: Skilled Nursing Facility Prior Approval Number:    Date Approved/Denied:   PASRR Number: [id number]  Discharge Plan: SNF    Current Diagnoses: Patient Active Problem List   Diagnosis Date Noted  . Atrial fibrillation with RVR (HCC) 12/16/2016  . Fall 12/16/2016  . Dementia 12/16/2016  . Hypotension 12/15/2016  . Dehydration 12/15/2016    Orientation RESPIRATION BLADDER Height & Weight     Self, Place  Normal Continent Weight: 125 lb 10.6 oz (57 kg) Height:  5\' 5"  (165.1 cm)  BEHAVIORAL SYMPTOMS/MOOD NEUROLOGICAL BOWEL NUTRITION STATUS      Continent    AMBULATORY STATUS COMMUNICATION OF NEEDS Skin   Extensive Assist Verbally                         Personal Care Assistance Level of Assistance  Bathing, Dressing Bathing Assistance: Limited assistance   Dressing Assistance: Limited assistance     Functional Limitations Info  Sight, Hearing Sight Info: Impaired Hearing Info: Impaired      SPECIAL CARE FACTORS FREQUENCY  PT (By licensed PT), OT (By licensed OT)     PT Frequency: 5/wk OT Frequency: 5/wk            Contractures      Additional Factors Info  Code Status, Allergies Code Status Info: DNR Allergies Info: NKA           Current Medications (12/16/2016):  This is the current hospital active medication list Current Facility-Administered Medications  Medication Dose Route Frequency Provider Last Rate Last Dose  . 0.9 %   sodium chloride infusion   Intravenous Continuous Ankit Joline Maxcyhirag Amin, MD 100 mL/hr at 12/16/16 1041    . albuterol (PROVENTIL) (2.5 MG/3ML) 0.083% nebulizer solution 2.5 mg  2.5 mg Inhalation Q6H PRN Ankit Chirag Amin, MD      . aspirin tablet 325 mg  325 mg Oral Daily Ankit Joline Maxcyhirag Amin, MD   325 mg at 12/16/16 1041  . diltiazem (CARDIZEM) 100 mg in dextrose 5% 100mL (1 mg/mL) infusion  5 mg/hr Intravenous Titrated Cathren LaineKevin Steinl, MD      . docusate sodium (COLACE) capsule 100 mg  100 mg Oral BID Ankit Joline Maxcyhirag Amin, MD   100 mg at 12/16/16 1041  . guaiFENesin (MUCINEX) 12 hr tablet 600 mg  600 mg Oral BID Ankit Chirag Amin, MD   600 mg at 12/16/16 1041  . HYDROcodone-acetaminophen (NORCO/VICODIN) 5-325 MG per tablet 1 tablet  1 tablet Oral Q6H PRN Ankit Joline Maxcyhirag Amin, MD      . multivitamin with minerals tablet 1 tablet  1 tablet Oral Daily Ankit Joline Maxcyhirag Amin, MD   1 tablet at 12/16/16 1041  . omega-3 acid ethyl esters (LOVAZA) capsule 2 g  2 g Oral Daily Ankit Chirag Amin, MD   2 g at 12/16/16 1041  . pantoprazole (PROTONIX) EC tablet 40 mg  40 mg Oral Daily Ankit Joline Maxcyhirag Amin, MD  40 mg at 12/16/16 1041  . piperacillin-tazobactam (ZOSYN) IVPB 2.25 g  2.25 g Intravenous Q8H Lauren D Bajbus, RPH   2.25 g at 12/16/16 1118  . [START ON 12/17/2016] vancomycin (VANCOCIN) IVPB 750 mg/150 ml premix  750 mg Intravenous Q48H Lauren D Bajbus, RPH         Discharge Medications: Please see discharge summary for a list of discharge medications.  Relevant Imaging Results:  Relevant Lab Results:   Additional Information SS#: [id number]  Burna Sis, LCSW

## 2016-12-16 NOTE — H&P (Signed)
History and Physical    Travis Bradley UEA:[id number] DOB: 23-Jun-1920 DOA: 12/15/2016  PCP: Romie Jumper, PA-C  Patient coming from: Home  Chief Complaint: Fall   HPI: Travis Bradley is a 83 y.o. male with medical history significant of with bph, possible copd was brought to the ed after unwitnessed fall. History is mostly per the records as patient has baseline dementia therefore unable to participate much in meaningfull hx and unable to get in touch with his daughter at this time.  Apparently patients daughter had been trying to get in touch with him for 2 days and then finally when she went over she found him on the ground therefore brought him to the Ed. Patient has had poor appetite recently and been suffering from progressive worsening of his dementia. Apparently he has a hernia repair surgery about a week ago.   ED Course: In the ED patient was noted to be hypotensive and in a fib with rvr. His BP improved with 3L of IVF but still remained in A fib with rvr for which he was started on Cardizem. His labs showed elevated Cr with signs of dehydration, elevated Ck and thrombocytopenia. He was accepted here by Dr Katrinka Blazing for [facility] level of care.    When I saw the patient he did not have any active complaints. He was resting comfortably. His BP was around 90s/50s with HR in 80s NSR.   Review of Systems: As per HPI otherwise 10 point review of systems negative.    Past Medical History:  Diagnosis Date  . COPD (chronic obstructive pulmonary disease) (HCC)   . Stomach ulcer   . Stomach ulcer     Past Surgical History:  Procedure Laterality Date  . HERNIA REPAIR       reports that he has never smoked. He has never used smokeless tobacco. He reports that he does not drink alcohol or use drugs.  No Known Allergies  No family history on file.   Prior to Admission medications   Medication Sig Start Date End Date Taking? Authorizing Provider  albuterol (PROAIR HFA) 108 (90 BASE)  MCG/ACT inhaler Inhale 2 puffs into the lungs every 6 (six) hours as needed for wheezing.   Yes Historical Provider, MD  aspirin 325 MG tablet Take 325 mg by mouth daily.   Yes Historical Provider, MD  docusate sodium (COLACE) 100 MG capsule Take 100 mg by mouth 2 (two) times daily.   Yes Historical Provider, MD  HYDROcodone-acetaminophen (NORCO/VICODIN) 5-325 MG tablet Take 1 tablet by mouth every 6 (six) hours as needed for moderate pain.   Yes Historical Provider, MD  nitroGLYCERIN (NITROSTAT) 0.4 MG SL tablet Place 0.4 mg under the tongue every 5 (five) minutes as needed for chest pain.   Yes Historical Provider, MD  pseudoephedrine-guaifenesin (MUCINEX D) 60-600 MG per tablet Take 1 tablet by mouth every 12 (twelve) hours.   Yes Historical Provider, MD  tamsulosin (FLOMAX) 0.4 MG CAPS capsule Take 0.4 mg by mouth.   Yes Historical Provider, MD  fish oil-omega-3 fatty acids 1000 MG capsule Take 2 g by mouth daily.    Historical Provider, MD  multivitamin-iron-minerals-folic acid (CENTRUM) chewable tablet Chew 1 tablet by mouth daily.    Historical Provider, MD  omeprazole (PRILOSEC) 10 MG capsule Take 10 mg by mouth daily.    Historical Provider, MD    Physical Exam: Vitals:   12/15/16 2245 12/15/16 2251 12/15/16 2300 12/16/16 0000  BP: 94/55 91/57 99/61  98/79  Pulse: 75 75 76 88  Resp: 19 21 19  (!) 23  Temp:  97.6 F (36.4 C)    TempSrc:  Axillary    SpO2: 98% 99% 99% 98%  Weight:      Height:          Constitutional: NAD, calm, comfortable. Frail appearing.  Vitals:   12/15/16 2245 12/15/16 2251 12/15/16 2300 12/16/16 0000  BP: 94/55 91/57 99/61  98/79  Pulse: 75 75 76 88  Resp: 19 21 19  (!) 23  Temp:  97.6 F (36.4 C)    TempSrc:  Axillary    SpO2: 98% 99% 99% 98%  Weight:      Height:       Eyes: PERRL, lids and conjunctivae normal ENMT: Mucous membranes are DRY Posterior pharynx clear of any exudate or lesions.Normal dentition.  Neck: normal, supple, no masses, no  thyromegaly Respiratory: clear to auscultation bilaterally, no wheezing, no crackles. Normal respiratory effort. No accessory muscle use.  Cardiovascular: Regular rate and rhythm, + systolic murmurs / rubs / gallops. No extremity edema. 2+ pedal pulses. No carotid bruits.  Abdomen: no tenderness, no masses palpated. No hepatosplenomegaly. Bowel sounds positive.  Musculoskeletal: no clubbing / cyanosis. No joint deformity upper and lower extremities. Good ROM, no contractures. Normal muscle tone.  Skin: no rashes, lesions, ulcers. No induration Neurologic: CN 2-12 grossly intact. Sensation intact, DTR normal. Strength 5/5 in all 4.  Psychiatric: Normal judgment and insight. Alert and oriented x 1-2. Normal mood.   Labs on Admission: I have personally reviewed following labs and imaging studies  CBC:  Recent Labs Lab 12/15/16 1712  WBC 8.7  HGB 11.8*  HCT 34.8*  MCV 92.1  PLT 64*   Basic Metabolic Panel:  Recent Labs Lab 12/15/16 1712  NA 141  K 4.1  CL 108  CO2 19*  GLUCOSE 98  BUN 86*  CREATININE 2.64*  CALCIUM 8.6*   GFR: Estimated Creatinine Clearance: 13.7 mL/min (by C-G formula based on SCr of 2.64 mg/dL (H)). Liver Function Tests: No results for input(s): AST, ALT, ALKPHOS, BILITOT, PROT, ALBUMIN in the last 168 hours. No results for input(s): LIPASE, AMYLASE in the last 168 hours. No results for input(s): AMMONIA in the last 168 hours. Coagulation Profile: No results for input(s): INR, PROTIME in the last 168 hours. Cardiac Enzymes:  Recent Labs Lab 12/15/16 1710  CKTOTAL 539*   BNP (last 3 results) No results for input(s): PROBNP in the last 8760 hours. HbA1C: No results for input(s): HGBA1C in the last 72 hours. CBG: No results for input(s): GLUCAP in the last 168 hours. Lipid Profile: No results for input(s): CHOL, HDL, LDLCALC, TRIG, CHOLHDL, LDLDIRECT in the last 72 hours. Thyroid Function Tests: No results for input(s): TSH, T4TOTAL, FREET4,  T3FREE, THYROIDAB in the last 72 hours. Anemia Panel: No results for input(s): VITAMINB12, FOLATE, FERRITIN, TIBC, IRON, RETICCTPCT in the last 72 hours. Urine analysis:    Component Value Date/Time   COLORURINE AMBER (A) 12/15/2016 1814   APPEARANCEUR CLOUDY (A) 12/15/2016 1814   LABSPEC 1.019 12/15/2016 1814   PHURINE 5.0 12/15/2016 1814   GLUCOSEU NEGATIVE 12/15/2016 1814   HGBUR LARGE (A) 12/15/2016 1814   BILIRUBINUR SMALL (A) 12/15/2016 1814   KETONESUR NEGATIVE 12/15/2016 1814   PROTEINUR 100 (A) 12/15/2016 1814   NITRITE NEGATIVE 12/15/2016 1814   LEUKOCYTESUR NEGATIVE 12/15/2016 1814   Sepsis Labs: !!!!!!!!!!!!!!!!!!!!!!!!!!!!!!!!!!!!!!!!!!!! @LABRCNTIP (procalcitonin:4,lacticidven:4) ) Recent Results (from the past 240 hour(s))  MRSA PCR Screening  Status: None   Collection Time: 12/16/16 12:03 AM  Result Value Ref Range Status   MRSA by PCR NEGATIVE NEGATIVE Final    Comment:        The GeneXpert MRSA Assay (FDA approved for NASAL specimens only), is one component of a comprehensive MRSA colonization surveillance program. It is not intended to diagnose MRSA infection nor to guide or monitor treatment for MRSA infections.      Radiological Exams on Admission: Dg Chest Port 1 View  Result Date: 12/15/2016 CLINICAL DATA:  Possible fall, left hip pain. EXAM: PORTABLE CHEST 1 VIEW COMPARISON:  None. FINDINGS: Heart size and mediastinal contours are within normal limits. Atherosclerotic changes noted at the aortic arch. Coarse interstitial markings are seen throughout both lungs, of uncertain chronicity, interstitial edema versus chronic interstitial lung disease. Suspect chronic bronchitic changes centrally. No confluent airspace opacity. No pleural effusion or pneumothorax seen. No osseous fracture or dislocation seen. IMPRESSION: 1. Coarse interstitial lung markings throughout both lungs, of uncertain chronicity, interstitial edema versus chronic interstitial lung  disease. 2. Suspect chronic bronchitic changes. 3. No evidence of consolidating pneumonia. 4. Aortic atherosclerosis. Electronically Signed   By: Bary Richard M.D.   On: 12/15/2016 20:25   Dg Hip Unilat W Or W/o Pelvis 2-3 Views Left  Result Date: 12/15/2016 CLINICAL DATA:  Left hip pain after possible fall. EXAM: DG HIP (WITH OR WITHOUT PELVIS) 2-3V LEFT COMPARISON:  None. FINDINGS: Femoral heads are located. Sacroiliac joints are symmetric. No acute fracture. Vascular calcifications. IMPRESSION: No acute osseous abnormality. Electronically Signed   By: Jeronimo Greaves M.D.   On: 12/15/2016 20:25    EKG: Independently reviewed. Sinus tachycardia, no acute st t changes.   Assessment/Plan Principal Problem:   Atrial fibrillation with RVR (HCC) Active Problems:   Hypotension   Dehydration   Fall   Dementia   Hypotension - improved with IVF -admit to step down for further care  -likely from dehydration  -will give IVF, monitor urine output. Ordered Condom catheter as he is incontinent at times per the nursing staff.  -bladder scan  And if more than 250cc - straight cath.  -cardizem has already been stopped  -avoid antihypertensives at this time.   A fib with rvr -resolved after being on Cardizem drip for sometimes prior to my evaluation  -monitor lytes at this time. Likely a paroxysmal episode.  -cont tele monitor  -consider echo if it becomes necessary   Unwitnessed fall  -no clear etiology, no focal signs of trauma at this time  -mental status at baseline, but if deteriotes he may need CT head/MRI brain to eval for stroke  -neurochecks for now  -PT/OT -check tsh and vit d  Mild rhabdomyolysis  -likely from the fall  -trend Ck, gentle IVF  AKI -no baseline Cr available. Avoid nephrotoxic drugs  -IVF, monitor Cr. If no improvement then order Renal US and further work up  -monitor strict Urine output   Thrombocytopenia -monitor for now, no need for tx. No signs of bleeding.     BPH -on flomax   GERD -on PPI  DVT prophylaxis: SCDs due to thrombocytopenia Code Status: DO NOT RESUSCITATE  Family Communication: Attempted to call the daughter but response. Will try again in the morning.  Disposition Plan:  Consults called: None required  Admission status: Stepdown admission due to tenuous blood pressure   Enoc Getter Joline Maxcy MD [facility]  If 7PM-7AM, please contact night-coverage [email or web address] Password [email or web address]  12/16/2016, 2:15 AM

## 2016-12-16 NOTE — Progress Notes (Signed)
PHARMACY - PHYSICIAN COMMUNICATION CRITICAL VALUE ALERT - BLOOD CULTURE IDENTIFICATION (BCID)  Results for orders placed or performed during the hospital encounter of 12/15/16  Blood Culture ID Panel (Reflexed) (Collected: 12/15/2016  6:05 PM)  Result Value Ref Range   Enterococcus species NOT DETECTED NOT DETECTED   Listeria monocytogenes NOT DETECTED NOT DETECTED   Staphylococcus species NOT DETECTED NOT DETECTED   Staphylococcus aureus NOT DETECTED NOT DETECTED   Streptococcus species NOT DETECTED NOT DETECTED   Streptococcus agalactiae NOT DETECTED NOT DETECTED   Streptococcus pneumoniae NOT DETECTED NOT DETECTED   Streptococcus pyogenes NOT DETECTED NOT DETECTED   Acinetobacter baumannii NOT DETECTED NOT DETECTED   Enterobacteriaceae species DETECTED (A) NOT DETECTED   Enterobacter cloacae complex NOT DETECTED NOT DETECTED   Escherichia coli DETECTED (A) NOT DETECTED   Klebsiella oxytoca NOT DETECTED NOT DETECTED   Klebsiella pneumoniae NOT DETECTED NOT DETECTED   Proteus species NOT DETECTED NOT DETECTED   Serratia marcescens NOT DETECTED NOT DETECTED   Carbapenem resistance NOT DETECTED NOT DETECTED   Haemophilus influenzae NOT DETECTED NOT DETECTED   Neisseria meningitidis NOT DETECTED NOT DETECTED   Pseudomonas aeruginosa NOT DETECTED NOT DETECTED   Candida albicans NOT DETECTED NOT DETECTED   Candida glabrata NOT DETECTED NOT DETECTED   Candida krusei NOT DETECTED NOT DETECTED   Candida parapsilosis NOT DETECTED NOT DETECTED   Candida tropicalis NOT DETECTED NOT DETECTED   83 year old male admitted after being found down. Note he had hernia repair surgery 1 week ago. He was started empirically on Vancomycin and Zosyn for hypotension and rule out sepsis. He has renal insufficiency and his SCr remains elevated above his baseline. His blood cultures are growing Gram negative rods and E coli is detected on BCID.    Name of physician (or Provider) Contacted: Danie BinderIskra Myers, MD  notified by text page via Amion, no return call received yet.  Changes to prescribed antibiotics required: Recommend changing Vancomycin and Zosyn to Ceftriaxone 2g IV q24h.  Can add Metronidazole 500mg  IV q8h if additional anaerobic coverage is needed for intraabdominal source.  Estella HuskMichelle Gene Glazebrook, Pharm.D., BCPS, AAHIVP Clinical Pharmacist Phone: [phone] or [phone] Pager: [phone] 12/16/2016, 12:12 PM

## 2016-12-16 NOTE — Evaluation (Signed)
Physical Therapy Evaluation Patient Details Name: Travis Bradley MRN: [id number] DOB: 05/28/1920 Today's Date: 12/16/2016   History of Present Illness  83 y.o.maleadmitted following an unwitnessed fall at home. X-rays negative for any orthopedic injury but noted to have atrial fibrillation, hypotension, and dehydration. PMH: dementia, COPD, recent hernia repair (approx. 1 week prior to admission).   Clinical Impression  Pt presenting as noted above. Pt moving very slowly and requiring extensive time and assistance to perform bed mobility and transfers. Pt was able to perform stand-pivot transfer with rw but was unable to attempt ambulation at this time. Repeated cues and physical assistance was needed for safety during session. No family was present to fully determine PLF and available help if any at D/C. Based upon the patient's current mobility, recommending SNF for further rehabilitation following his acute stay. PT to continue to follow to progress as tolerated.     Follow Up Recommendations SNF;Supervision for mobility/OOB    Equipment Recommendations  Rolling walker with 5" wheels    Recommendations for Other Services       Precautions / Restrictions Precautions Precautions: Fall Restrictions Weight Bearing Restrictions: No      Mobility  Bed Mobility Overal bed mobility: Needs Assistance Bed Mobility: Supine to Sit     Supine to sit: Mod assist     General bed mobility comments: HOB elevated, pt moving very slowly, needing assist at trunk and LEs.   Transfers Overall transfer level: Needs assistance Equipment used: Rolling walker (2 wheeled) Transfers: Sit to/from [employer] Sit to Stand: Min assist Stand pivot transfers: Min assist       General transfer comment: Repeated cues and physical assist needed for safety.   Ambulation/Gait                Stairs            Wheelchair Mobility    Modified Rankin (Stroke Patients  Only)       Balance Overall balance assessment: Needs assistance Sitting-balance support: Single extremity supported Sitting balance-Leahy Scale: Fair     Standing balance support: Bilateral upper extremity supported Standing balance-Leahy Scale: Poor Standing balance comment: using rw for support                             Pertinent Vitals/Pain Pain Assessment: Faces Faces Pain Scale: Hurts little more Pain Location: Rt hip Pain Descriptors / Indicators: Grimacing;Guarding Pain Intervention(s): Limited activity within patient's tolerance;Monitored during session    Home Living Family/patient expects to be discharged to:: Private residence Living Arrangements: Alone Available Help at Discharge: Family (reports having a daughter) Type of Home: House Home Access: Stairs to enter Entrance Stairs-Rails:  (holds post to get in) Secretary/administrator of Steps: 3 Home Layout: One level Home Equipment: None Additional Comments: per pt report    Prior Function Level of Independence: Independent         Comments: per pt report     Hand Dominance        Extremity/Trunk Assessment   Upper Extremity Assessment Upper Extremity Assessment: Generalized weakness    Lower Extremity Assessment Lower Extremity Assessment: RLE deficits/detail RLE Deficits / Details: assist needed to move with bed mobility    Cervical / Trunk Assessment Cervical / Trunk Assessment: Kyphotic  Communication   Communication: HOH  Cognition Arousal/Alertness: Awake/alert Behavior During Therapy: WFL for tasks assessed/performed Overall Cognitive Status: No family/caregiver present to determine baseline cognitive functioning  General Comments: history of dementia, repeated cues needed for safety, repeating same story multiple times. Pt oriented to situation and month    General Comments General comments (skin integrity, edema, etc.): SpO2 remaining in 90s  throughout session, BP125/72 supine,  136/65 sitting after session.     Exercises     Assessment/Plan    PT Assessment Patient needs continued PT services  PT Problem List Decreased strength;Decreased activity tolerance;Decreased balance;Decreased range of motion;Decreased mobility;Decreased safety awareness       PT Treatment Interventions DME instruction;Gait training;Stair training;Functional mobility training;Therapeutic activities;Therapeutic exercise;Patient/family education    PT Goals (Current goals can be found in the Care Plan section)  Acute Rehab PT Goals Patient Stated Goal: return home PT Goal Formulation: With patient Time For Goal Achievement: 12/30/16 Potential to Achieve Goals: Fair    Frequency Min 2X/week   Barriers to discharge        Co-evaluation               End of Session Equipment Utilized During Treatment: Gait belt;Oxygen Activity Tolerance: Patient limited by fatigue Patient left: in chair;with call bell/phone within reach;with chair alarm set Nurse Communication: Mobility status PT Visit Diagnosis: Unsteadiness on feet (R26.81);Muscle weakness (generalized) (M62.81);History of falling (Z91.81)         Time: [id number] PT Time Calculation (min) (ACUTE ONLY): 39 min   Charges:   PT Evaluation $PT Eval Moderate Complexity: 1 Procedure PT Treatments $Therapeutic Activity: 23-37 mins   PT G Codes:         Christiane HaBenjamin J. Jacky Hartung, PT, CSCS Pager [phone] Office [phone]  12/16/2016, 1:02 PM

## 2016-12-17 DIAGNOSIS — Z79899 Other long term (current) drug therapy: Secondary | ICD-10-CM

## 2016-12-17 DIAGNOSIS — Z7982 Long term (current) use of aspirin: Secondary | ICD-10-CM

## 2016-12-17 DIAGNOSIS — A498 Other bacterial infections of unspecified site: Secondary | ICD-10-CM

## 2016-12-17 DIAGNOSIS — I4891 Unspecified atrial fibrillation: Secondary | ICD-10-CM

## 2016-12-17 DIAGNOSIS — F0390 Unspecified dementia without behavioral disturbance: Secondary | ICD-10-CM

## 2016-12-17 LAB — BASIC METABOLIC PANEL
ANION GAP: 8 (ref 5–15)
BUN: 49 mg/dL — ABNORMAL HIGH (ref 6–20)
CHLORIDE: 117 mmol/L — AB (ref 101–111)
CO2: 19 mmol/L — AB (ref 22–32)
Calcium: 8 mg/dL — ABNORMAL LOW (ref 8.9–10.3)
Creatinine, Ser: 1.74 mg/dL — ABNORMAL HIGH (ref 0.61–1.24)
GFR calc non Af Amer: 32 mL/min — ABNORMAL LOW (ref >60)
GFR, EST AFRICAN AMERICAN: 37 mL/min — AB (ref >60)
Glucose, Bld: 123 mg/dL — ABNORMAL HIGH (ref 65–99)
POTASSIUM: 3.8 mmol/L (ref 3.5–5.1)
Sodium: 144 mmol/L (ref 135–145)

## 2016-12-17 LAB — GLUCOSE, CAPILLARY
GLUCOSE-CAPILLARY: 111 mg/dL — AB (ref 65–99)
GLUCOSE-CAPILLARY: 127 mg/dL — AB (ref 65–99)
Glucose-Capillary: 131 mg/dL — ABNORMAL HIGH (ref 65–99)
Glucose-Capillary: 132 mg/dL — ABNORMAL HIGH (ref 65–99)

## 2016-12-17 LAB — CBC
HEMATOCRIT: 34 % — AB (ref 39.0–52.0)
HEMOGLOBIN: 11.2 g/dL — AB (ref 13.0–17.0)
MCH: 30.3 pg (ref 26.0–34.0)
MCHC: 32.9 g/dL (ref 30.0–36.0)
MCV: 91.9 fL (ref 78.0–100.0)
Platelets: 73 10*3/uL — ABNORMAL LOW (ref 150–400)
RBC: 3.7 MIL/uL — ABNORMAL LOW (ref 4.22–5.81)
RDW: 14.6 % (ref 11.5–15.5)
WBC: 16.3 10*3/uL — AB (ref 4.0–10.5)

## 2016-12-17 LAB — URINE CULTURE

## 2016-12-17 LAB — VITAMIN D 25 HYDROXY (VIT D DEFICIENCY, FRACTURES): VIT D 25 HYDROXY: 24.5 ng/mL — AB (ref 30.0–100.0)

## 2016-12-17 MED ORDER — DILTIAZEM HCL 25 MG/5ML IV SOLN
5.0000 mg | Freq: Once | INTRAVENOUS | Status: AC
  Administered 2016-12-17: 5 mg via INTRAVENOUS
  Filled 2016-12-17: qty 5

## 2016-12-17 MED ORDER — DILTIAZEM LOAD VIA INFUSION
5.0000 mg | Freq: Once | INTRAVENOUS | Status: DC
  Filled 2016-12-17: qty 5

## 2016-12-17 MED ORDER — HEPARIN (PORCINE) IN NACL 100-0.45 UNIT/ML-% IJ SOLN
1250.0000 [IU]/h | INTRAMUSCULAR | Status: DC
  Administered 2016-12-17: 900 [IU]/h via INTRAVENOUS
  Filled 2016-12-17: qty 250

## 2016-12-17 MED ORDER — AMIODARONE HCL IN DEXTROSE 360-4.14 MG/200ML-% IV SOLN
60.0000 mg/h | INTRAVENOUS | Status: DC
  Administered 2016-12-17: 60 mg/h via INTRAVENOUS
  Filled 2016-12-17: qty 200

## 2016-12-17 MED ORDER — AMIODARONE LOAD VIA INFUSION
150.0000 mg | Freq: Once | INTRAVENOUS | Status: AC
  Administered 2016-12-17: 150 mg via INTRAVENOUS
  Filled 2016-12-17: qty 83.34

## 2016-12-17 MED ORDER — AMIODARONE HCL IN DEXTROSE 360-4.14 MG/200ML-% IV SOLN: 30.0000 mg/h | INTRAVENOUS | Status: DC

## 2016-12-17 MED ORDER — SODIUM CHLORIDE 0.9 % IV BOLUS (SEPSIS)
500.0000 mL | Freq: Once | INTRAVENOUS | Status: AC
  Administered 2016-12-17: 500 mL via INTRAVENOUS

## 2016-12-17 MED ORDER — SODIUM CHLORIDE 0.9 % IV SOLN
INTRAVENOUS | Status: AC
  Administered 2016-12-17: 20:00:00 via INTRAVENOUS

## 2016-12-17 MED ORDER — PIPERACILLIN-TAZOBACTAM 3.375 G IVPB
3.3750 g | Freq: Three times a day (TID) | INTRAVENOUS | Status: DC
  Administered 2016-12-17 – 2016-12-18 (×3): 3.375 g via INTRAVENOUS
  Filled 2016-12-17 (×5): qty 50

## 2016-12-17 MED ORDER — DILTIAZEM HCL 25 MG/5ML IV SOLN: 5.0000 mg | Freq: Once | INTRAVENOUS | Status: DC

## 2016-12-17 NOTE — Care Management Note (Signed)
Case Management Note  Patient Details  Name: Travis Bradley MRN: [id number] Date of Birth: [birth year]  Subjective/Objective:   CSW and CM met with pt and daughter.  CSW attempted multiple times to discuss rehab bed @ SNF but pt would not participate in conversation.  Daughter indicated her extreme frustration in attempting to assist pt as he refuses all attempts.  She also indicates that the home he lives in "is a mess."  If pt continues to refuse, a psych eval for competency will be needed.                            Expected Discharge Plan:  Skilled Nursing Facility  In-House Referral:  Clinical Social Work  Discharge planning Services  CM Consult  Status of Service:  In process, will continue to follow  Girard Cooter, RN 12/17/2016, 3:44 PM

## 2016-12-17 NOTE — Progress Notes (Signed)
Order to give patient 5 mg cardizem IV push. Patient HR only reduced from 170-180 to 140-160. Information shared with day shift RN and patient continuing to be monitored closely. BP has dropped to 77/56.  Noe GensStefanie A Damary Doland, RN

## 2016-12-17 NOTE — Progress Notes (Signed)
Pharmacy Antibiotic Note Hermina BartersWade O Obara is a 83 y.o. male admitted on 12/15/2016 with E.coli bacteremia in setting of recent surgery for hernia around 1 week ago. Currently on day 3 of Zosyn for treatment.   SCr improved overnight. Final c/s of blood cultures are still pending.   Plan: 1. With improvement in renal function will adjust Zosyn to 3.375 grams IV every 8 hours (infused over 4 hours)  2. Consider deescalating abx to Ceftriaxone 2 grams IV every 24 hours with addition of Flagyl 500 mg IV every 8 hours if anaerobic coverage desired   Height: 5\' 5"  (165.1 cm) Weight: 128 lb 12 oz (58.4 kg) IBW/kg (Calculated) : 61.5  Temp (24hrs), Avg:98.3 F (36.8 C), Min:97.4 F (36.3 C), Max:99 F (37.2 C)   Recent Labs Lab 12/15/16 1712 12/15/16 1812 12/15/16 2041 12/16/16 0202 12/17/16 0223  WBC 8.7  --   --  21.3* 16.3*  CREATININE 2.64*  --   --  2.21* 1.74*  LATICACIDVEN  --  2.10* 1.18  --   --     Estimated Creatinine Clearance: 21.4 mL/min (by C-G formula based on SCr of 1.74 mg/dL (H)).    No Known Allergies  Antimicrobials this admission:  3/4 Zosyn >>  3/4 Vancomycin >> 3/5  Microbiology results: 3/4 BCx: E.coli per BCID 3/4 UCx: pending   3/4 MRSA PCR: negative   Thank you for allowing pharmacy to be a part of this patient's care.   Pollyann SamplesAndy Cynithia Hakimi, PharmD, BCPS 12/17/2016, 9:03 AM

## 2016-12-17 NOTE — Plan of Care (Signed)
Problem: Education: Goal: Knowledge of [facility] Education information/materials will improve Outcome: Progressing Discussed plan of care with patient. His orientation fluctuates, but his family has been very involved in his care.

## 2016-12-17 NOTE — Progress Notes (Signed)
Cardizem 5mg   IVP given.  BP 90/67.  HR 169, R-19, patient denies chest pain.  Wanted to speak to daughter. I called her and left a message.  Will continue to monitor closely.

## 2016-12-17 NOTE — Plan of Care (Signed)
Problem: Physical Regulation: Goal: Ability to maintain clinical measurements within normal limits will improve Outcome: Not Progressing Patient's HR increased into 160's-170's. This is sustained. Called and notified NP. EKG shows Afib RVR.  Order given to start cardizem.   Problem: Skin Integrity: Goal: Risk for impaired skin integrity will decrease Outcome: Progressing Patient able to turn self, encouraging him to do so and assisting with bigger moves when appropriate.

## 2016-12-17 NOTE — Progress Notes (Signed)
Daughter Lynden AngCathy called to check on patient. Notified about elevated HR.  Stated, "Tell dad I'll be by to check on him after work."

## 2016-12-17 NOTE — Clinical Social Work Note (Signed)
Clinical Social Work Assessment  Patient Details  Name: Travis Bradley MRN: [id number] Date of Birth: 02/14/1920  Date of referral:  12/17/16               Reason for consult:  Facility Placement                Permission sought to share information with:  Ocean[facility]ility Contact Representative Permission granted to share information::  Yes, Verbal Permission Granted  Name::     Buyer, retailCathy  Agency::  SNF  Relationship::  dtr  Contact Information:     Housing/Transportation Living arrangements for the past 2 months:  Single Family Home Source of Information:  Patient, Adult Children Patient Interpreter Needed:  None Criminal Activity/Legal Involvement Pertinent to Current Situation/Hospitalization:  No - Comment as needed Significant Relationships:  Adult Children Lives with:  Self Do you feel safe going back to the place where you live?  No (family does not think its safe) Need for family participation in patient care:  Yes (Comment) (help with decision making)  Care giving concerns:  Pt lives at home alone but dtr lives nearby- dtr unable to provide consistent assistance or physical assistance to pt.  Dtr expresses concerns with pt being able to care for himself at home especially with recent fall which brought him to the hospital.   Social Worker assessment / plan:  CSW spoke with pt and pt dtr regarding PT recommendation for SNF.  Patient alert and responded to questions but usually did not answer question but complained about current care.  Patient does not feel as if he has seen a doctor and is anxious to leave the hospital.  Pt able to reiterate what happened prior to admission (fall and unable to get up) but was not accurate about timeline.  CSW attempted to discuss SNF multiple times with patient but he continued to redirect conversation and state that if someone would get him pain mediations he would be able to walk because he wouldn't have hip pain- patient acknowledges he was weak prior  to admission but attributes that to not eating for several days.  Pt dtr at bedside- CSW discussed SNF recommendation with dtr as well who endorses pt would not be safe to return home alone at this time.   Employment status:  Retired Database administratornsurance information:  Managed Medicare PT Recommendations:  Skilled Nursing Facility Information / Referral to community resources:  Skilled Nursing Facility  Patient/Family's Response to care:  Pt not interested in discussing SNF at this time and does not seem to be interested in rehab but will not answer questions directly.  Dtr is in full agreement for SNF and will continue to discuss issue with patient.  Patient/Family's Understanding of and Emotional Response to Diagnosis, Current Treatment, and Prognosis:  Pt does not seem to have good understanding of situation or recommendations and dtr is frustrated trying to care for pt with his stubbornness.  Emotional Assessment Appearance:  Appears stated age Attitude/Demeanor/Rapport:  Complaining, Reactive Affect (typically observed):    Orientation:  Oriented to Self, Oriented to Place Alcohol / Substance use:  Not Applicable Psych involvement (Current and /or in the community):  No (Comment)  Discharge Needs  Concerns to be addressed:  Care Coordination, Home Safety Concerns Readmission within the last 30 days:  No Current discharge risk:  Physical Impairment Barriers to Discharge:  Continued Medical Work up   Burna SisUris, Bayla Mcgovern H, LCSW 12/17/2016, 8:17 AM

## 2016-12-17 NOTE — Consult Note (Signed)
Consultation Note Date: 12/18/16  [name]  DOB: [birth year]  MRN: [id number]  Age / Sex: 83 y.o., male  PCP: Shellia Carwin, PA-C Referring Physician: Theodis Blaze, MD  Reason for Consultation: Establishing goals of care for elderly gentleman living at home with recent decline after hernia repair 11/11/16.   HPI/Patient Profile: 83 y.o. male  with past medical history of BPH, COPD, living at home admitted on 12/15/2016 with fall and found on floor next to bed after daughter could not reach him for 2 days. Found to have E. coli in his blood and hospitalization complicated by new A. fib RVR.   Clinical Assessment and Goals of Care: I met today at [name] bedside along with himself and daughter, Juliann Pulse. We were joined by Dr. Louretta Shorten who assessed [name]. [name] is legally blind and extremely hard of hearing so conversations with him are very difficult.   [name] is very clear that he wishes to go home. He has a hard time remembering why he must stay in the hospital at this time. He is able to recount his history quite well. Juliann Pulse says that he seems more confused and slower in his talking than usual. I also noted that he was chewing on the same bite of eggs for an hour and had residue in his mouth that came out when he spoke - I have requested speech therapy evaluation and discussed this with his daughter. He has also recently lost weight and had poor appetite.   I spoke more with Juliann Pulse at bedside. Juliann Pulse says that he is extremely "stubborn" and has refused help and refused to buy a hearing aide (he did not want to spend the money). Juliann Pulse says his house is in no condition to live and that if someone called for assessment "I'm sure it would be condemned." He is not refusing SNF rehab today and seems willing to consider it as a goal to help him get home.   I also spoke with Juliann Pulse  who says that her father does have a DNR (she has a brother that has gone through resuscitation and [name] would never want this). She says that he is aware he is not a candidate for surgery for his AAA. Their goal is to treat the treatable and and keep him safe as she does not feel like his home is safe for him.   Primary Decision Maker NEXT OF KIN Lewanda Rife daughter    SUMMARY OF RECOMMENDATIONS   - DNR - Treat the treatable - Main concern is safe disposition - SLP evaluation  Code Status/Advance Care Planning:  DNR   Symptom Management:   Pain generalized and in hip: Would recommend acetaminophen prn.   Palliative Prophylaxis:   Aspiration, Bowel Regimen, Delirium Protocol and Frequent Pain Assessment  Additional Recommendations (Limitations, Scope, Preferences):  No Surgical Procedures  Psycho-social/Spiritual:   Desire for further Chaplaincy support:no  Additional Recommendations: Caregiving  Support/Resources  Prognosis:   Unable to determine  Discharge  Planning: Outlook for rehab with Palliative care service follow-up      Primary Diagnoses: Present on Admission: . Hypotension . Dehydration   I have reviewed the medical record, interviewed the patient and family, and examined the patient. The following aspects are pertinent.  Past Medical History:  Diagnosis Date  . COPD (chronic obstructive pulmonary disease) ([place])   . Stomach ulcer   . Stomach ulcer    Social History   Social History  . Marital status: Married    Spouse name: N/A  . Number of children: N/A  . Years of education: N/A   Social History Main Topics  . Smoking status: Never Smoker  . Smokeless tobacco: Never Used  . Alcohol use No  . Drug use: No  . Sexual activity: No   Other Topics Concern  . None   Social History Narrative  . None   No family history on file. Scheduled Meds: . aspirin  325 mg Oral Daily  . docusate sodium  100 mg Oral  BID  . guaiFENesin  600 mg Oral BID  . multivitamin with minerals  1 tablet Oral Daily  . omega-3 acid ethyl esters  2 g Oral Daily  . pantoprazole  40 mg Oral Daily  . piperacillin-tazobactam (ZOSYN)  IV  3.375 g Intravenous Q8H  . sodium chloride  500 mL Intravenous Once   Continuous Infusions: . sodium chloride    . heparin 900 Units/hr (12/17/16 1603)   PRN Meds:.albuterol, HYDROcodone-acetaminophen No Known Allergies Review of Systems  Constitutional: Positive for activity change, appetite change and fatigue.  Respiratory: Negative for shortness of breath.   Cardiovascular: Negative for chest pain.  Neurological: Positive for weakness.    Physical Exam  Constitutional: He is oriented to person, place, and time. He appears well-developed.  HENT:  Head: Normocephalic and atraumatic.  Cardiovascular: Normal rate.  An irregularly irregular rhythm present.  Pulmonary/Chest: Effort normal. No accessory muscle usage. No tachypnea. No respiratory distress.  Abdominal: Soft. Normal appearance.  Neurological: He is alert and oriented to person, place, and time.  Confused at times during conversation  Nursing note and vitals reviewed.   Vital Signs: BP (!) 82/66 (BP Location: Left Arm)   Pulse (!) 160   Temp 97.6 F (36.4 C) (Oral)   Resp 16   Ht _0  (1.651 m)   Wt 58.4 kg (128 lb 12 oz)   SpO2 98%   BMI 21.42 kg/m  Pain Assessment: No/denies pain   Pain Score: 0-No pain   SpO2: SpO2: 98 % O2 Device:SpO2: 98 % O2 Flow Rate: .O2 Flow Rate (L/min): 2 L/min  IO: Intake/output summary:  Intake/Output Summary (Last 24 hours) at 12/17/16 1858 Last data filed at 12/17/16 1252  Gross per 24 hour  Intake           826.67 ml  Output              950 ml  Net          -123.33 ml    LBM: Last BM Date: 12/15/16 Baseline Weight: Weight: 56.7 kg (125 lb) Most recent weight: Weight: 58.4 kg (128 lb 12 oz)     Palliative Assessment/Data:   Flowsheet Rows   Flowsheet Row  Most Recent Value  Intake Tab  Referral Department  Hospitalist  Unit at Time of Referral  Intermediate Care Unit  Palliative Care Primary Diagnosis  Cardiac  Date Notified  12/17/16  Palliative Care Type  New Palliative  care  Reason for referral  Clarify Goals of Care  Date of Admission  12/15/16  # of days IP prior to Palliative referral  2  Clinical Assessment  Psychosocial & Spiritual Assessment  Palliative Care Outcomes      Time In: 1100 Time Out: 1210 Time Total: 105mn Greater than 50%  of this time was spent counseling and coordinating care related to the above assessment and plan.  Signed by: AVinie Sill NP Palliative Medicine Team Pager # [phone](M-F 8a-5p) Team Phone # [phone](Nights/Weekends)

## 2016-12-17 NOTE — Progress Notes (Signed)
CSW called and left message for pt dtr regarding SNF options- left list at bedside.  Burna SisJenna H. Noriel Guthrie, LCSW Clinical Social Worker [phone]

## 2016-12-17 NOTE — Consult Note (Signed)
CARDIOLOGY CONSULT NOTE   Patient ID: Travis Bradley MRN: [id number] DOB/AGE: [birth year] 81 y.o.  Admit date: 12/15/2016  Primary Physician   Romie Jumper, PA-C Primary Cardiologist   New to Dr. Excell Seltzer Reason for Consultation   Afib RVR Requesting Physician  Dr. Izola Price  HPI: Travis Bradley is a 83 y.o. male with a history of AAA that has now enlarged to 4.8 cm by duplex (followed by Dr. Sondra Come --> he is due for study in 12/2016), HTN, COPD, baseline dementia and s/p recent left inguinal hernia repair with mesh 11/2016 presented for fall at home 12/15/16. Daughter unable to get on touch for 2 days and then finally went home and found him on ground.   In ER he was found  Hypotensive and afib RVR. Given 3 L of fluid and IV cardizem and conversion to sinus rhythm in ER. He was found to have a dehydration, elevated Ck and thrombocytopenia. Due to concern of sepsis, pt was started on Vanc and Zosyn. Last night his blood culture grow E-coli. Stopped vancomycin. Currently on day 3 of Zosyn for treatment. This morning he again went into afib with RVR. He was given IV cardizem 5 mg X 2 without improvement of rate. He become hypotensive and cardiology is asked for further evaluation.   At baseline he is active per note. He is confused here. Hard to hear. His only complains of "soreness of R hip". The patient denies nausea, vomiting, fever, chest pain, palpitations, shortness of breath, orthopnea, PND, dizziness, syncope, cough, congestion, abdominal pain, hematochezia, melena, lower extremity edema.  Telemetry showed afib with RVR at rate of 160-170s since this morning around 6am. Scr 2.21-->1.74. Troponin 0.14-->0.09-->0.08. Normal TSH.   Past Medical History:  Diagnosis Date  . COPD (chronic obstructive pulmonary disease) ([facility])   . Stomach ulcer   . Stomach ulcer      Past Surgical History:  Procedure Laterality Date  . HERNIA REPAIR      No Known Allergies  I have reviewed the patient's  current medications . aspirin  325 mg Oral Daily  . diltiazem  5 mg Intravenous Once  . docusate sodium  100 mg Oral BID  . guaiFENesin  600 mg Oral BID  . multivitamin with minerals  1 tablet Oral Daily  . omega-3 acid ethyl esters  2 g Oral Daily  . pantoprazole  40 mg Oral Daily  . piperacillin-tazobactam (ZOSYN)  IV  3.375 g Intravenous Q8H   . sodium chloride    . dilTIAZem HCl-Dextrose     albuterol, HYDROcodone-acetaminophen  Prior to Admission medications   Medication Sig Start Date End Date Taking? Authorizing Provider  albuterol (PROAIR HFA) 108 (90 BASE) MCG/ACT inhaler Inhale 2 puffs into the lungs every 6 (six) hours as needed for wheezing.   Yes Historical Provider, MD  aspirin 325 MG tablet Take 325 mg by mouth daily.   Yes Historical Provider, MD  docusate sodium (COLACE) 100 MG capsule Take 100 mg by mouth 2 (two) times daily.   Yes Historical Provider, MD  HYDROcodone-acetaminophen (NORCO/VICODIN) 5-325 MG tablet Take 1 tablet by mouth every 6 (six) hours as needed for moderate pain.   Yes Historical Provider, MD  nitroGLYCERIN (NITROSTAT) 0.4 MG SL tablet Place 0.4 mg under the tongue every 5 (five) minutes as needed for chest pain.   Yes Historical Provider, MD  pseudoephedrine-guaifenesin (MUCINEX D) 60-600 MG per tablet Take 1 tablet by mouth every 12 (twelve) hours.   Yes  Historical Provider, MD  tamsulosin (FLOMAX) 0.4 MG CAPS capsule Take 0.4 mg by mouth.   Yes Historical Provider, MD  fish oil-omega-3 fatty acids 1000 MG capsule Take 2 g by mouth daily.    Historical Provider, MD  multivitamin-iron-minerals-folic acid (CENTRUM) chewable tablet Chew 1 tablet by mouth daily.    Historical Provider, MD  omeprazole (PRILOSEC) 10 MG capsule Take 10 mg by mouth daily.    Historical Provider, MD     Social History   Social History  . Marital status: Married    Spouse name: N/A  . Number of children: N/A  . Years of education: N/A   Occupational History  . Not  on file.   Social History Main Topics  . Smoking status: Never Smoker  . Smokeless tobacco: Never Used  . Alcohol use No  . Drug use: No  . Sexual activity: No   Other Topics Concern  . Not on file   Social History Narrative  . No narrative on file    Family Status  Relation Status  . Mother Deceased  . Father Deceased  . Maternal Grandmother Deceased  . Maternal Grandfather Deceased  . Paternal Grandmother Deceased  . Paternal Grandfather Deceased   Family History  Problem Relation Age of Onset  . Glaucoma Mother  . Heart disease Mother  . Heart disease Father  . Cancer Brother  carcinoma lg intestine, prostate  . Heart attack Brother  . Heart disease Brother  . Stroke Brother        ROS:  Full 14 point review of systems complete and found to be negative unless listed above.  Physical Exam: Blood pressure 90/67, pulse (!) 169, temperature 98.3 F (36.8 C), temperature source Oral, resp. rate 18, height 5\' 5"  (1.651 m), weight 128 lb 12 oz (58.4 kg), SpO2 98 %.  General: thin frail chronically ill  male in no acute distress Head: Eyes PERRLA, No xanthomas. Normocephalic and atraumatic, oropharynx without edema or exudate.  Lungs: Resp regular and unlabored, CTA. Heart: regular tachycardic  no s3, s4, or murmurs..   Neck: No carotid bruits. No lymphadenopathy.  No JVD. Abdomen: Bowel sounds present, abdomen soft and non-tender without masses or hernias noted. Msk:  No spine or cva tenderness. No weakness, no joint deformities or effusions. Extremities: No clubbing, cyanosis or edema. DP/PT/Radials 2+ and equal bilaterally. L groin cath site without erythema or edema.  Neuro: hard to hear and confused Psych: Confused  Skin: No rashes or lesions noted.  Labs:   Lab Results  Component Value Date   WBC 16.3 (H) 12/17/2016   HGB 11.2 (L) 12/17/2016   HCT 34.0 (L) 12/17/2016   MCV 91.9 12/17/2016   PLT 73 (L) 12/17/2016   No results for input(s): INR in the  last 72 hours.  Recent Labs Lab 12/16/16 0202 12/17/16 0223  NA 142 144  K 3.7 3.8  CL 114* 117*  CO2 19* 19*  BUN 71* 49*  CREATININE 2.21* 1.74*  CALCIUM 7.6* 8.0*  PROT 5.5*  --   BILITOT 1.7*  --   ALKPHOS 136*  --   ALT 89*  --   AST 58*  --   GLUCOSE 114* 123*  ALBUMIN 2.1*  --    Magnesium  Date Value Ref Range Status  12/16/2016 2.0 1.7 - 2.4 mg/dL Final    Recent Labs  46/96/2902/01/29 1710 12/16/16 0202 12/16/16 0733 12/16/16 1346  CKTOTAL 539* 363  --   --  TROPONINI  --  0.14* 0.09* 0.08*   No results for input(s): TROPIPOC in the last 72 hours. No results found for: PROBNP No results found for: CHOL, HDL, LDLCALC, TRIG No results found for: DDIMER No results found for: LIPASE, AMYLASE TSH  Date/Time Value Ref Range Status  12/16/2016 07:33 AM 0.914 0.350 - 4.500 uIU/mL Final    Comment:    Performed by a 3rd Generation assay with a functional sensitivity of <=0.01 uIU/mL.   No results found for: VITAMINB12, FOLATE, FERRITIN, TIBC, IRON, RETICCTPCT  ECG:  Sinus tachycardiac at rate of 122 bpm. Repeat EKG showed at rate of 184 bpm  Radiology:  Dg Chest Port 1 View  Result Date: 12/15/2016 CLINICAL DATA:  Possible fall, left hip pain. EXAM: PORTABLE CHEST 1 VIEW COMPARISON:  None. FINDINGS: Heart size and mediastinal contours are within normal limits. Atherosclerotic changes noted at the aortic arch. Coarse interstitial markings are seen throughout both lungs, of uncertain chronicity, interstitial edema versus chronic interstitial lung disease. Suspect chronic bronchitic changes centrally. No confluent airspace opacity. No pleural effusion or pneumothorax seen. No osseous fracture or dislocation seen. IMPRESSION: 1. Coarse interstitial lung markings throughout both lungs, of uncertain chronicity, interstitial edema versus chronic interstitial lung disease. 2. Suspect chronic bronchitic changes. 3. No evidence of consolidating pneumonia. 4. Aortic atherosclerosis.  Electronically Signed   By: Bary Richard M.D.   On: 12/15/2016 20:25   Dg Hip Unilat W Or W/o Pelvis 2-3 Views Left  Result Date: 12/15/2016 CLINICAL DATA:  Left hip pain after possible fall. EXAM: DG HIP (WITH OR WITHOUT PELVIS) 2-3V LEFT COMPARISON:  None. FINDINGS: Femoral heads are located. Sacroiliac joints are symmetric. No acute fracture. Vascular calcifications. IMPRESSION: No acute osseous abnormality. Electronically Signed   By: Jeronimo Greaves M.D.   On: 12/15/2016 20:25    ASSESSMENT AND PLAN:     1. Atrial fibrillation with RVR ([facility]) - New onset. Noted in afib rvr after he came her in ER (based on EKG timing). Converted to sinus rhythm in ER after IV Cardizem. Again went into afib RVR this morning. Rate is sustained to 160-180s even after IV Cardizem 5mg  x 2.  He became hypotensive. Asymptomatic. CHADSVASc score of 3 (Age and HTN). Will get echo. Start IV amiodarone. No sign of bleeding. Hgb stable to 11.2. Will review anticoagulation options with MD.  - Likely due to sepsis and bacteremia. TSH normal.   2. Bacteremia in setting of recent surgery for hernia  - Surgery site is OK. Unknown source. Recommended ID consult. Also recommended palliative consult to discuss goal of care. Seem acute at baseline. Confused here.   3. AAA - Enlarged to 4.8 cm by duplex per note of  Dr. Sondra Come 06/2016.  He is due for study in 12/2016   Signed: [place], [place] 12/17/2016, 1:20 PM Pager [phone]  Co-Sign MD  Patient seen, examined. Available data reviewed. Agree with findings, assessment, and plan as outlined by Chelsea Aus, PA-C. The patient is independently interviewed and examined. He is a pleasant elderly man in no distress. Heart is tachycardic and irregular. Lungs fields are clear. Abdomen is soft and nontender. There is no pretibial edema. The patient is mentating normally, but his systolic blood pressure is about 75 mmHg. He is in atrial fib with RVR in the setting of bacteremia. Will  give him a bolus of normal saline 500 cc stat. Will start him on amiodarone bolus and drip to try to control his heart rhythm in the  setting of hypotension. Would start IV heparin in this elderly but functional gentleman. Depending on his clinical course, we'll make a decision about oral anticoagulation in the next 24-48 hours.  Tonny Bollman, M.D. 12/17/2016 5:33 PM

## 2016-12-17 NOTE — Progress Notes (Signed)
Patient ID: Travis Bradley, male   DOB: [birth year], 83 y.o.   MRN: [id number]    PROGRESS NOTE    Travis Bradley  FIE:[id number] DOB: [birth year] DOA: 12/15/2016  PCP: Romie Jumper, PA-C   Brief Narrative:  83 y.o. male with medical history significant of with bph, possible copd was brought to the ed after unwitnessed fall. History is mostly per the records as patient has baseline dementia therefore unable to participate much in meaningfull hx and unable to get in touch with his daughter at this time.  Apparently patients daughter had been trying to get in touch with him for 2 days and then finally when she went over she found him on the ground therefore brought him to the Ed. Patient has had poor appetite recently and been suffering from progressive worsening of his dementia. Apparently he has a hernia repair surgery about a week ago.   Assessment & Plan:   Sepsis secondary to E. Coli Bacteremia - unclear etiology - vancomycin has been stopped and pt currently on Zosyn, continue Zosyn day #2 - plan to narrow down when sensitivity report is back  - ID consulted, Dr. Luciana Axe recommends about 10 days of oral antibiotics at discharge depending on sensivities  - plan to repeat blood cultures to assure clearance due to having a mesh - WBC is trending down and no fevers this AM  - keep in SDU as pt is still hypotensive  - called daughter for update and left message to call me back     Hypotension secondary to sepsis  - keep on IVF - monitor vitals in SDU for now    A-fib with RVR, mild trop elevation  - has been started on Cardizem drip but titration limited due to hypotension - HR this AM in 180's - trop elevated likely from a-fib, pt very clear that he has no chest pain this AM - cardiology consulted, assistance appreciated  - would not check trop unless pt with active chest pain - ECHO pending     Unwitnessed fall, pt with known dementia  - pt does not not recall the event - wants to  go home - it appears that he is baseline mental status - I have called his daughter to further discuss - PCT also consulted to address further GOC  - PT/OT eval requested if pt able to participate    Mild rhabdomyolysis, transaminitis  - rhabdo is likely from fall - LFT's up in the setting of rhabdo  - has been on IVF - CK in AM     AKI - no baseline Cr available. Avoid nephrotoxic drugs  - appears to be pre renal - Cr is trending down with IVF - BMP in AM    Thrombocytopenia - unclear etiology - no signs of bleeding - stable in the past 24 hours - CBC in AM    BPH - on flomax     GERD - on PPI  DVT prophylaxis: currently on heparin drip  Code Status: DNR Family Communication: Patient at bedside, called daughter and left her a message on her cell phone to call me back  Disposition Plan: to be determined   Consultants:   Cardiology  ID  PCT  Procedures:   None  Antimicrobials:   Zosyn 3/5 -->  Subjective: Pt reports he is hungry and wants to eat. Denies chest pain or dyspnea this am.   Objective: Vitals:   12/17/16 0834 12/17/16 1100 12/17/16 1217 12/17/16 1400  BP: 97/77 (!) 137/120 90/67 (!) 85/54  Pulse: (!) 166 (!) 175 (!) 169 (!) 164  Resp: 18  18 16   Temp:   98.3 F (36.8 C)   TempSrc:   Oral   SpO2: 98%  98% 98%  Weight:      Height:        Intake/Output Summary (Last 24 hours) at 12/17/16 1540 Last data filed at 12/17/16 1252  Gross per 24 hour  Intake          1126.67 ml  Output             1250 ml  Net          -123.33 ml   Filed Weights   12/15/16 2028 12/16/16 0500 12/17/16 0355  Weight: 56.7 kg (125 lb) 57 kg (125 lb 10.6 oz) 58.4 kg (128 lb 12 oz)    Examination:  General exam: Appears calm and comfortable  Respiratory system: diminished breath sounds at bases Cardiovascular system: IRRR. No rubs, gallops or clicks. No pedal edema. Gastrointestinal system: Abdomen is nondistended, soft and nontender. No organomegaly  or masses felt.  Central nervous system: Alert and oriented. No focal neurological deficits. Extremities: Symmetric 5 x 5 power.  Data Reviewed: I have personally reviewed following labs and imaging studies  CBC:  Recent Labs Lab 12/15/16 1712 12/16/16 0202 12/17/16 0223  WBC 8.7 21.3* 16.3*  HGB 11.8* 10.2* 11.2*  HCT 34.8* 30.8* 34.0*  MCV 92.1 91.7 91.9  PLT 64* 62* 73*   Basic Metabolic Panel:  Recent Labs Lab 12/15/16 1712 12/16/16 0202 12/17/16 0223  NA 141 142 144  K 4.1 3.7 3.8  CL 108 114* 117*  CO2 19* 19* 19*  GLUCOSE 98 114* 123*  BUN 86* 71* 49*  CREATININE 2.64* 2.21* 1.74*  CALCIUM 8.6* 7.6* 8.0*  MG  --  2.0  --   PHOS  --  3.6  --    Liver Function Tests:  Recent Labs Lab 12/16/16 0202  AST 58*  ALT 89*  ALKPHOS 136*  BILITOT 1.7*  PROT 5.5*  ALBUMIN 2.1*   Cardiac Enzymes:  Recent Labs Lab 12/15/16 1710 12/16/16 0202 12/16/16 0733 12/16/16 1346  CKTOTAL 539* 363  --   --   TROPONINI  --  0.14* 0.09* 0.08*   CBG:  Recent Labs Lab 12/16/16 2237 12/17/16 0756 12/17/16 1217  GLUCAP 127* 131* 111*   Thyroid Function Tests:  Recent Labs  12/16/16 0733  TSH 0.914   Urine analysis:    Component Value Date/Time   COLORURINE AMBER (A) 12/15/2016 1814   APPEARANCEUR CLOUDY (A) 12/15/2016 1814   LABSPEC 1.019 12/15/2016 1814   PHURINE 5.0 12/15/2016 1814   GLUCOSEU NEGATIVE 12/15/2016 1814   HGBUR LARGE (A) 12/15/2016 1814   BILIRUBINUR SMALL (A) 12/15/2016 1814   KETONESUR NEGATIVE 12/15/2016 1814   PROTEINUR 100 (A) 12/15/2016 1814   NITRITE NEGATIVE 12/15/2016 1814   LEUKOCYTESUR NEGATIVE 12/15/2016 1814   Recent Results (from the past 240 hour(s))  Blood Culture (routine x 2)     Status: Abnormal (Preliminary result)   Collection Time: 12/15/16  6:05 PM  Result Value Ref Range Status   Specimen Description BLOOD RIGHT ARM  Final   Special Requests   Final    BOTTLES DRAWN AEROBIC AND ANAEROBIC 10CC BOTH BOTTLES    Culture  Setup Time   Final    GRAM NEGATIVE RODS IN BOTH AEROBIC AND ANAEROBIC BOTTLES Organism ID to follow CRITICAL  RESULT CALLED TO, READ BACK BY AND VERIFIED WITH: Artelia Laroche, PHARM, 12/16/16 AT 1205 BY J FUDESCO Performed at [facility], [address] [place], [place] [address]    Culture ESCHERICHIA COLI (A)  Final   Report Status PENDING  Incomplete  Blood Culture (routine x 2)     Status: None (Preliminary result)   Collection Time: 12/15/16  6:05 PM  Result Value Ref Range Status   Specimen Description BLOOD LEFT ARM  Final   Special Requests   Final    BOTTLES DRAWN AEROBIC AND ANAEROBIC 5CC BOTH BOTTLES   Culture  Setup Time   Final    GRAM NEGATIVE RODS ANAEROBIC BOTTLE ONLY CRITICAL RESULT CALLED TO, READ BACK BY AND VERIFIED WITH: Artelia Laroche, PHARM, 12/16/16 AT 1205 BY J FUDESCO Performed at [facility], [address] [place], [place] [address]    Culture GRAM NEGATIVE RODS  Final   Report Status PENDING  Incomplete  Blood Culture ID Panel (Reflexed)     Status: Abnormal   Collection Time: 12/15/16  6:05 PM  Result Value Ref Range Status   Enterococcus species NOT DETECTED NOT DETECTED Final   Listeria monocytogenes NOT DETECTED NOT DETECTED Final   Staphylococcus species NOT DETECTED NOT DETECTED Final   Staphylococcus aureus NOT DETECTED NOT DETECTED Final   Streptococcus species NOT DETECTED NOT DETECTED Final   Streptococcus agalactiae NOT DETECTED NOT DETECTED Final   Streptococcus pneumoniae NOT DETECTED NOT DETECTED Final   Streptococcus pyogenes NOT DETECTED NOT DETECTED Final   Acinetobacter baumannii NOT DETECTED NOT DETECTED Final   Enterobacteriaceae species DETECTED (A) NOT DETECTED Final    Comment: Enterobacteriaceae represent a large family of gram-negative bacteria, not a single organism. CRITICAL RESULT CALLED TO, READ BACK BY AND VERIFIED WITH: M. TURNER, PHARM, 12/16/16 AT 1205 BY J FUDESCO    Enterobacter cloacae complex NOT  DETECTED NOT DETECTED Final   Escherichia coli DETECTED (A) NOT DETECTED Final    Comment: CRITICAL RESULT CALLED TO, READ BACK BY AND VERIFIED WITH: M. TURNER, PHARM, 12/16/16 AT 1205 BY J FUDESCO    Klebsiella oxytoca NOT DETECTED NOT DETECTED Final   Klebsiella pneumoniae NOT DETECTED NOT DETECTED Final   Proteus species NOT DETECTED NOT DETECTED Final   Serratia marcescens NOT DETECTED NOT DETECTED Final   Carbapenem resistance NOT DETECTED NOT DETECTED Final   Haemophilus influenzae NOT DETECTED NOT DETECTED Final   Neisseria meningitidis NOT DETECTED NOT DETECTED Final   Pseudomonas aeruginosa NOT DETECTED NOT DETECTED Final   Candida albicans NOT DETECTED NOT DETECTED Final   Candida glabrata NOT DETECTED NOT DETECTED Final   Candida krusei NOT DETECTED NOT DETECTED Final   Candida parapsilosis NOT DETECTED NOT DETECTED Final   Candida tropicalis NOT DETECTED NOT DETECTED Final    Comment: Performed at [facility], [address] [place], [place] [address]  Urine culture     Status: Abnormal   Collection Time: 12/15/16  6:14 PM  Result Value Ref Range Status   Specimen Description URINE, CLEAN CATCH  Final   Special Requests NONE  Final   Culture MULTIPLE SPECIES PRESENT, SUGGEST RECOLLECTION (A)  Final   Report Status 12/17/2016 FINAL  Final  MRSA PCR Screening     Status: None   Collection Time: 12/16/16 12:03 AM  Result Value Ref Range Status   MRSA by PCR NEGATIVE NEGATIVE Final    Comment:        The GeneXpert  MRSA Assay (FDA approved for NASAL specimens only), is one component of a comprehensive MRSA colonization surveillance program. It is not intended to diagnose MRSA infection nor to guide or monitor treatment for MRSA infections.     Radiology Studies: Dg Chest Port 1 View  Result Date: 12/15/2016 CLINICAL DATA:  Possible fall, left hip pain. EXAM: PORTABLE CHEST 1 VIEW COMPARISON:  None. FINDINGS: Heart size and mediastinal contours are within  normal limits. Atherosclerotic changes noted at the aortic arch. Coarse interstitial markings are seen throughout both lungs, of uncertain chronicity, interstitial edema versus chronic interstitial lung disease. Suspect chronic bronchitic changes centrally. No confluent airspace opacity. No pleural effusion or pneumothorax seen. No osseous fracture or dislocation seen. IMPRESSION: 1. Coarse interstitial lung markings throughout both lungs, of uncertain chronicity, interstitial edema versus chronic interstitial lung disease. 2. Suspect chronic bronchitic changes. 3. No evidence of consolidating pneumonia. 4. Aortic atherosclerosis. Electronically Signed   By: Bary Richard M.D.   On: 12/15/2016 20:25   Dg Hip Unilat W Or W/o Pelvis 2-3 Views Left  Result Date: 12/15/2016 CLINICAL DATA:  Left hip pain after possible fall. EXAM: DG HIP (WITH OR WITHOUT PELVIS) 2-3V LEFT COMPARISON:  None. FINDINGS: Femoral heads are located. Sacroiliac joints are symmetric. No acute fracture. Vascular calcifications. IMPRESSION: No acute osseous abnormality. Electronically Signed   By: Jeronimo Greaves M.D.   On: 12/15/2016 20:25    Scheduled Meds: . amiodarone  150 mg Intravenous Once  . aspirin  325 mg Oral Daily  . docusate sodium  100 mg Oral BID  . guaiFENesin  600 mg Oral BID  . multivitamin with minerals  1 tablet Oral Daily  . omega-3 acid ethyl esters  2 g Oral Daily  . pantoprazole  40 mg Oral Daily  . piperacillin-tazobactam (ZOSYN)  IV  3.375 g Intravenous Q8H   Continuous Infusions: . sodium chloride    . amiodarone     Followed by  . amiodarone    . heparin      LOS: 2 days   Time spent: 20 minutes   Debbora Presto, MD [facility] Pager [phone]  If 7PM-7AM, please contact night-coverage [email or web address] Password [email or web address] 12/17/2016, 3:40 PM

## 2016-12-17 NOTE — Progress Notes (Signed)
ANTICOAGULATION CONSULT NOTE - Initial Consult  Pharmacy Consult for heparin Indication: atrial fibrillation  No Known Allergies  Patient Measurements: Height: 5\' 5"  (165.1 cm) Weight: 128 lb 12 oz (58.4 kg) IBW/kg (Calculated) : 61.5  Vital Signs: Temp: 98.3 F (36.8 C) (03/06 1217) Temp Source: Oral (03/06 1217) BP: 85/54 (03/06 1400) Pulse Rate: 164 (03/06 1400)  Assessment: 83 yo M presents on 3/4 after fall from home. Now has new onset Afib with RVR. Pharmacy consulted to start heparin. Not on any anticoag PTA. Hgb 11.2, plts low at 73  Goal of Therapy:  Heparin level 0.3-0.7 units/ml Monitor platelets by anticoagulation protocol: Yes   Plan:  No heparin bolus Start heparin gtt at 900 units/hr Check 8 hr heparin level Monitor daily heparin level, CBC, s/s of bleed  Travis Bradley, PharmD, [facility] HospitalBCPS Clinical Pharmacist Pager [phone] 12/17/2016 2:43 PM

## 2016-12-17 NOTE — Consult Note (Signed)
[facility]       Reason for Consult: E coli bacteremia    Referring Physician: Dr. Izola PriceMyers  Principal Problem:   Atrial fibrillation with RVR [facility](HCC) Active Problems:   Hypotension   Dehydration   Fall   Dementia   . amiodarone  150 mg Intravenous Once  . aspirin  325 mg Oral Daily  . docusate sodium  100 mg Oral BID  . guaiFENesin  600 mg Oral BID  . multivitamin with minerals  1 tablet Oral Daily  . omega-3 acid ethyl esters  2 g Oral Daily  . pantoprazole  40 mg Oral Daily  . piperacillin-tazobactam (ZOSYN)  IV  3.375 g Intravenous Q8H  . sodium chloride  500 mL Intravenous Once    Recommendations: Continue with antibiotics Narrow once sensitivities out Will need about 10 days of oral antibiotics at discharge depending on sensivities  I will repeat blood cultures to assure clearance due to having a mesh  Assessment: He has E coli in 2/2 blood cultures.  Potential sources include urine, GI.   He also had surgery about 2 months ago for inguinal hernia repair with mesh placement. No sign of infection.   Antibiotics: zosyn  HPI: Travis Bradley is a 83 y.o. male with dementia, COPD and recent open inguinal hernia repair with mesh placement in January 2018 who was reportedly found down at home by his daughter following a fall.  Denies any current fever or chills.  On admission the lactate was minimally elevated at 2.1, he was hypotensive and responded to fluids.  Also noted now to be in Afib with RVR.  Patient states he has hip pain since the fall if he moves around and feels "lousy".  Xray without fracture. No associated n/v/d.  Difficult historian due to being hard of hearing.     Review of Systems:  Constitutional: negative for fevers and chills Genitourinary: negative for dysuria Integument/breast: negative for rash Musculoskeletal: negative for + hip pain All other systems reviewed and are negative    Past Medical History:  Diagnosis  Date  . COPD (chronic obstructive pulmonary disease) (HCC)   . Stomach ulcer   . Stomach ulcer     Social History  Substance Use Topics  . Smoking status: Never Smoker  . Smokeless tobacco: Never Used  . Alcohol use No    FMH: mother with glaucoma, father and mother with heart disease, brother with MI  No Known Allergies  Physical Exam: Constitutional: in no apparent distress  Vitals:   12/17/16 1217 12/17/16 1400  BP: 90/67 (!) 85/54  Pulse: (!) 169 (!) 164  Resp: 18 16  Temp: 98.3 F (36.8 C)    EYES: anicteric ENMT: no thrush Cardiovascular: Cor irreg, irreg RRR Respiratory: CTA B; normal respiratory effort GI: Bowel sounds are normal, liver is not enlarged, spleen is not enlarged Musculoskeletal: no pedal edema noted Skin: negatives: no rash  Lab Results  Component Value Date   WBC 16.3 (H) 12/17/2016   HGB 11.2 (L) 12/17/2016   HCT 34.0 (L) 12/17/2016   MCV 91.9 12/17/2016   PLT 73 (L) 12/17/2016    Lab Results  Component Value Date   CREATININE 1.74 (H) 12/17/2016   BUN 49 (H) 12/17/2016   NA 144 12/17/2016   K 3.8 12/17/2016   CL 117 (H) 12/17/2016   CO2 19 (L) 12/17/2016    Lab Results  Component Value Date   ALT 89 (H) 12/16/2016  AST 58 (H) 12/16/2016   ALKPHOS 136 (H) 12/16/2016     Microbiology: Recent Results (from the past 240 hour(s))  Blood Culture (routine x 2)     Status: Abnormal (Preliminary result)   Collection Time: 12/15/16  6:05 PM  Result Value Ref Range Status   Specimen Description BLOOD RIGHT ARM  Final   Special Requests   Final    BOTTLES DRAWN AEROBIC AND ANAEROBIC 10CC BOTH BOTTLES   Culture  Setup Time   Final    GRAM NEGATIVE RODS IN BOTH AEROBIC AND ANAEROBIC BOTTLES Organism ID to follow CRITICAL RESULT CALLED TO, READ BACK BY AND VERIFIED WITH: Artelia Laroche, PHARM, 12/16/16 AT 1205 BY J FUDESCO Performed at [facility], [address] [place], [place] [address]    Culture ESCHERICHIA COLI (A)  Final    Report Status PENDING  Incomplete  Blood Culture (routine x 2)     Status: None (Preliminary result)   Collection Time: 12/15/16  6:05 PM  Result Value Ref Range Status   Specimen Description BLOOD LEFT ARM  Final   Special Requests   Final    BOTTLES DRAWN AEROBIC AND ANAEROBIC 5CC BOTH BOTTLES   Culture  Setup Time   Final    GRAM NEGATIVE RODS ANAEROBIC BOTTLE ONLY CRITICAL RESULT CALLED TO, READ BACK BY AND VERIFIED WITH: Artelia Laroche, PHARM, 12/16/16 AT 1205 BY J FUDESCO Performed at [facility], [address], [place], [place] [address]    Culture GRAM NEGATIVE RODS  Final   Report Status PENDING  Incomplete  Blood Culture ID Panel (Reflexed)     Status: Abnormal   Collection Time: 12/15/16  6:05 PM  Result Value Ref Range Status   Enterococcus species NOT DETECTED NOT DETECTED Final   Listeria monocytogenes NOT DETECTED NOT DETECTED Final   Staphylococcus species NOT DETECTED NOT DETECTED Final   Staphylococcus aureus NOT DETECTED NOT DETECTED Final   Streptococcus species NOT DETECTED NOT DETECTED Final   Streptococcus agalactiae NOT DETECTED NOT DETECTED Final   Streptococcus pneumoniae NOT DETECTED NOT DETECTED Final   Streptococcus pyogenes NOT DETECTED NOT DETECTED Final   Acinetobacter baumannii NOT DETECTED NOT DETECTED Final   Enterobacteriaceae species DETECTED (A) NOT DETECTED Final    Comment: Enterobacteriaceae represent a large family of gram-negative bacteria, not a single organism. CRITICAL RESULT CALLED TO, READ BACK BY AND VERIFIED WITH: M. TURNER, PHARM, 12/16/16 AT 1205 BY J FUDESCO    Enterobacter cloacae complex NOT DETECTED NOT DETECTED Final   Escherichia coli DETECTED (A) NOT DETECTED Final    Comment: CRITICAL RESULT CALLED TO, READ BACK BY AND VERIFIED WITH: M. TURNER, PHARM, 12/16/16 AT 1205 BY J FUDESCO    Klebsiella oxytoca NOT DETECTED NOT DETECTED Final   Klebsiella pneumoniae NOT DETECTED NOT DETECTED Final   Proteus species NOT DETECTED  NOT DETECTED Final   Serratia marcescens NOT DETECTED NOT DETECTED Final   Carbapenem resistance NOT DETECTED NOT DETECTED Final   Haemophilus influenzae NOT DETECTED NOT DETECTED Final   Neisseria meningitidis NOT DETECTED NOT DETECTED Final   Pseudomonas aeruginosa NOT DETECTED NOT DETECTED Final   Candida albicans NOT DETECTED NOT DETECTED Final   Candida glabrata NOT DETECTED NOT DETECTED Final   Candida krusei NOT DETECTED NOT DETECTED Final   Candida parapsilosis NOT DETECTED NOT DETECTED Final   Candida tropicalis NOT DETECTED NOT DETECTED Final    Comment: Performed at [facility], [address], [place],  [place] [address]  Urine culture     Status: Abnormal   Collection Time: 12/15/16  6:14 PM  Result Value Ref Range Status   Specimen Description URINE, CLEAN CATCH  Final   Special Requests NONE  Final   Culture MULTIPLE SPECIES PRESENT, SUGGEST RECOLLECTION (A)  Final   Report Status 12/17/2016 FINAL  Final  MRSA PCR Screening     Status: None   Collection Time: 12/16/16 12:03 AM  Result Value Ref Range Status   MRSA by PCR NEGATIVE NEGATIVE Final    Comment:        The GeneXpert MRSA Assay (FDA approved for NASAL specimens only), is one component of a comprehensive MRSA colonization surveillance program. It is not intended to diagnose MRSA infection nor to guide or monitor treatment for MRSA infections.     Staci Righter, MD [facility] [email or web address] [phone] pager  [phone] cell 12/17/2016, 2:49 PM

## 2016-12-17 NOTE — Progress Notes (Signed)
Patient HR back up to 160-178. BP 137/120. Dr. Denna HaggardI. Myers notified. Order for 5mg  cardizem IVP.  Stated that a cardiology consult has been made.

## 2016-12-18 ENCOUNTER — Inpatient Hospital Stay (HOSPITAL_COMMUNITY): Payer: Medicare Other

## 2016-12-18 DIAGNOSIS — G3109 Other frontotemporal dementia: Secondary | ICD-10-CM | POA: Diagnosis present

## 2016-12-18 DIAGNOSIS — Z515 Encounter for palliative care: Secondary | ICD-10-CM

## 2016-12-18 DIAGNOSIS — E860 Dehydration: Secondary | ICD-10-CM

## 2016-12-18 DIAGNOSIS — I959 Hypotension, unspecified: Secondary | ICD-10-CM

## 2016-12-18 DIAGNOSIS — W19XXXA Unspecified fall, initial encounter: Secondary | ICD-10-CM

## 2016-12-18 DIAGNOSIS — F0280 Dementia in other diseases classified elsewhere without behavioral disturbance: Secondary | ICD-10-CM

## 2016-12-18 DIAGNOSIS — R0600 Dyspnea, unspecified: Secondary | ICD-10-CM

## 2016-12-18 DIAGNOSIS — Z7189 Other specified counseling: Secondary | ICD-10-CM

## 2016-12-18 DIAGNOSIS — A419 Sepsis, unspecified organism: Secondary | ICD-10-CM

## 2016-12-18 DIAGNOSIS — I951 Orthostatic hypotension: Secondary | ICD-10-CM

## 2016-12-18 DIAGNOSIS — R7881 Bacteremia: Secondary | ICD-10-CM

## 2016-12-18 LAB — CBC
HEMATOCRIT: 32 % — AB (ref 39.0–52.0)
HEMOGLOBIN: 10.5 g/dL — AB (ref 13.0–17.0)
MCH: 30.2 pg (ref 26.0–34.0)
MCHC: 32.8 g/dL (ref 30.0–36.0)
MCV: 92 fL (ref 78.0–100.0)
Platelets: 96 10*3/uL — ABNORMAL LOW (ref 150–400)
RBC: 3.48 MIL/uL — ABNORMAL LOW (ref 4.22–5.81)
RDW: 15 % (ref 11.5–15.5)
WBC: 15.3 10*3/uL — ABNORMAL HIGH (ref 4.0–10.5)

## 2016-12-18 LAB — CULTURE, BLOOD (ROUTINE X 2)

## 2016-12-18 LAB — GLUCOSE, CAPILLARY
Glucose-Capillary: 89 mg/dL (ref 65–99)
Glucose-Capillary: 91 mg/dL (ref 65–99)
Glucose-Capillary: 130 mg/dL — ABNORMAL HIGH (ref 65–99)
Glucose-Capillary: 167 mg/dL — ABNORMAL HIGH (ref 65–99)
Glucose-Capillary: 111 mg/dL — ABNORMAL HIGH (ref 65–99)

## 2016-12-18 LAB — BASIC METABOLIC PANEL
Anion gap: 8 (ref 5–15)
BUN: 40 mg/dL — AB (ref 6–20)
CALCIUM: 8.1 mg/dL — AB (ref 8.9–10.3)
CHLORIDE: 115 mmol/L — AB (ref 101–111)
CO2: 20 mmol/L — AB (ref 22–32)
CREATININE: 1.69 mg/dL — AB (ref 0.61–1.24)
GFR calc Af Amer: 38 mL/min — ABNORMAL LOW (ref >60)
GFR calc non Af Amer: 33 mL/min — ABNORMAL LOW (ref >60)
GLUCOSE: 108 mg/dL — AB (ref 65–99)
Potassium: 3.3 mmol/L — ABNORMAL LOW (ref 3.5–5.1)
Sodium: 143 mmol/L (ref 135–145)

## 2016-12-18 LAB — ECHOCARDIOGRAM COMPLETE
Height: 65 in
Weight: 2225.76 oz

## 2016-12-18 LAB — HEPARIN LEVEL (UNFRACTIONATED)
HEPARIN UNFRACTIONATED: 0.11 [IU]/mL — AB (ref 0.30–0.70)
HEPARIN UNFRACTIONATED: 0.14 [IU]/mL — AB (ref 0.30–0.70)

## 2016-12-18 LAB — CK: Total CK: 42 U/L — ABNORMAL LOW (ref 49–397)

## 2016-12-18 MED ORDER — APIXABAN 2.5 MG PO TABS
2.5000 mg | ORAL_TABLET | Freq: Two times a day (BID) | ORAL | Status: DC
  Administered 2016-12-18 – 2016-12-20 (×5): 2.5 mg via ORAL
  Filled 2016-12-18 (×6): qty 1

## 2016-12-18 MED ORDER — SODIUM CHLORIDE 0.9 % IV SOLN
2.0000 g | Freq: Two times a day (BID) | INTRAVENOUS | Status: DC
  Administered 2016-12-18 – 2016-12-20 (×5): 2 g via INTRAVENOUS
  Filled 2016-12-18 (×6): qty 2000

## 2016-12-18 MED ORDER — RESOURCE THICKENUP CLEAR PO POWD
Freq: Once | ORAL | Status: DC
  Filled 2016-12-18: qty 125

## 2016-12-18 MED ORDER — POTASSIUM CHLORIDE CRYS ER 20 MEQ PO TBCR
40.0000 meq | EXTENDED_RELEASE_TABLET | Freq: Once | ORAL | Status: AC
  Administered 2016-12-18: 40 meq via ORAL
  Filled 2016-12-18: qty 2

## 2016-12-18 MED ORDER — AMIODARONE HCL 200 MG PO TABS
400.0000 mg | ORAL_TABLET | Freq: Two times a day (BID) | ORAL | Status: DC
  Administered 2016-12-18 (×2): 400 mg via ORAL
  Filled 2016-12-18 (×3): qty 2

## 2016-12-18 NOTE — Progress Notes (Signed)
  Echocardiogram 2D Echocardiogram has been performed.  Travis [name] Travis Bradley 12/18/2016, 10:08 AM

## 2016-12-18 NOTE — Consult Note (Signed)
[facility] Consult   Reason for Consult:  capacity evaluation Referring Physician:  Dr. Ree Kida Patient Identification: Travis Bradley MRN:  [id number] Principal Diagnosis: Atrial fibrillation with RVR [facility]) Diagnosis:   Patient Active Problem List   Diagnosis Date Noted  . Atrial fibrillation with RVR ([place]) [I48.91] 12/16/2016  . Fall [W19.XXXA] 12/16/2016  . Dementia [F03.90] 12/16/2016  . Hypotension [I95.9] 12/15/2016  . Dehydration [E86.0] 12/15/2016    Total Time spent with patient: 45 minutes  Subjective:   Travis Bradley is a 83 y.o. male patient admitted with fall.  HPI:  Travis Bradley is a 83 y.o. male with medical history significant of with bph, possible copd was brought to the Emergency department after unwitnessed fall. Patient seen, chart reviewed and case discussed with patient daughter was at bedside. Reportedly patient underwent hernia repair about a month ago and received pain medication. Patient has been somewhat confused about his medications because he cannot see well reportedly legally blind and also having a hard hearing but stubborn about getting a hearing aids. Patient reportedly fixing his own meals and also fixed the problem being at home. Patient takes blame if something does not go well at his home. Patient reported he does not know what happened with unwitnessed fall and need of coming to the hospital. Patient also has some memory deficits. Patient reported 2 days Monday and so Wednesday. Patient has been oriented to himself, his daughter, able to name his first name, millilambert and lasted and at the same time first name and last name of the daughter. his date of birth and he also elevated that his been in the hospital and able to name name of the hospital but could not tell me why he was hospitalized. Patient daughter told me he was hospitalized because family has to call the emergency medical service. Reportedly he is not responding to the  phone calls and when emergency medical service found him room, he was on the floor next to his bed. Patient has no appetite injuries. Reportedly patient has decreased functionality and able to care for himself since he started taking medication.   ED Course: In the ED patient was noted to be hypotensive and in a fib with rvr. His BP improved with 3L of IVF but still remained in A fib with rvr for which he was started on Cardizem. His labs showed elevated Cr with signs of dehydration, elevated Ck and thrombocytopenia. He was accepted here by Dr Tamala Julian for [facility] level of care.   Past Psychiatric History: Patient has no previous acute psychiatric hospitalization or outpatient treatments.  Risk to Self: Is patient at risk for suicide?: No Risk to Others:   Prior Inpatient Therapy:   Prior Outpatient Therapy:    Past Medical History:  Past Medical History:  Diagnosis Date  . COPD (chronic obstructive pulmonary disease) ([place])   . Stomach ulcer   . Stomach ulcer     Past Surgical History:  Procedure Laterality Date  . HERNIA REPAIR     Family History: No family history on file. Family Psychiatric  History: Patient son suffered with a traumatic brain injury secondary to accidental overdose of alcohol and heroin required nursing placement. Patient daughter is supportive to him and we can stay came to the nursing home to meet his son. Social History:  History  Alcohol Use No     History  Drug Use No    Social History   Social History  . Marital status:  Married    Spouse name: N/A  . Number of children: N/A  . Years of education: N/A   Social History Main Topics  . Smoking status: Never Smoker  . Smokeless tobacco: Never Used  . Alcohol use No  . Drug use: No  . Sexual activity: No   Other Topics Concern  . None   Social History Narrative  . None   Additional Social History:    Allergies:  No Known Allergies  Labs:  Results for orders placed or performed during the  hospital encounter of 12/15/16 (from the past 48 hour(s))  Troponin I     Status: Abnormal   Collection Time: 12/16/16  1:46 PM  Result Value Ref Range   Troponin I 0.08 (HH) <0.03 ng/mL    Comment: CRITICAL VALUE NOTED.  VALUE IS CONSISTENT WITH PREVIOUSLY REPORTED AND CALLED VALUE.  Glucose, capillary     Status: Abnormal   Collection Time: 12/16/16 10:37 PM  Result Value Ref Range   Glucose-Capillary 127 (H) 65 - 99 mg/dL  CBC     Status: Abnormal   Collection Time: 12/17/16  2:23 AM  Result Value Ref Range   WBC 16.3 (H) 4.0 - 10.5 K/uL   RBC 3.70 (L) 4.22 - 5.81 MIL/uL   Hemoglobin 11.2 (L) 13.0 - 17.0 g/dL   HCT 34.0 (L) 39.0 - 52.0 %   MCV 91.9 78.0 - 100.0 fL   MCH 30.3 26.0 - 34.0 pg   MCHC 32.9 30.0 - 36.0 g/dL   RDW 14.6 11.5 - 15.5 %   Platelets 73 (L) 150 - 400 K/uL    Comment: CONSISTENT WITH PREVIOUS RESULT  Basic metabolic panel     Status: Abnormal   Collection Time: 12/17/16  2:23 AM  Result Value Ref Range   Sodium 144 135 - 145 mmol/L   Potassium 3.8 3.5 - 5.1 mmol/L   Chloride 117 (H) 101 - 111 mmol/L   CO2 19 (L) 22 - 32 mmol/L   Glucose, Bld 123 (H) 65 - 99 mg/dL   BUN 49 (H) 6 - 20 mg/dL   Creatinine, Ser 1.74 (H) 0.61 - 1.24 mg/dL   Calcium 8.0 (L) 8.9 - 10.3 mg/dL   GFR calc non Af Amer 32 (L) >60 mL/min   GFR calc Af Amer 37 (L) >60 mL/min    Comment: (NOTE) The eGFR has been calculated using the CKD EPI equation. This calculation has not been validated in all clinical situations. eGFR's persistently <60 mL/min signify possible Chronic Kidney Disease.    Anion gap 8 5 - 15  Glucose, capillary     Status: Abnormal   Collection Time: 12/17/16  7:56 AM  Result Value Ref Range   Glucose-Capillary 131 (H) 65 - 99 mg/dL  Glucose, capillary     Status: Abnormal   Collection Time: 12/17/16 12:17 PM  Result Value Ref Range   Glucose-Capillary 111 (H) 65 - 99 mg/dL  Glucose, capillary     Status: Abnormal   Collection Time: 12/17/16  4:14 PM   Result Value Ref Range   Glucose-Capillary 127 (H) 65 - 99 mg/dL  Glucose, capillary     Status: Abnormal   Collection Time: 12/17/16  9:59 PM  Result Value Ref Range   Glucose-Capillary 132 (H) 65 - 99 mg/dL   Comment 1 Notify RN    Comment 2 Document in Chart   Heparin level (unfractionated)     Status: Abnormal   Collection Time: 12/17/16 11:41 PM  Result Value Ref Range   Heparin Unfractionated 0.14 (L) 0.30 - 0.70 IU/mL    Comment:        IF HEPARIN RESULTS ARE BELOW EXPECTED VALUES, AND PATIENT DOSAGE HAS BEEN CONFIRMED, SUGGEST FOLLOW UP TESTING OF ANTITHROMBIN III LEVELS.   Glucose, capillary     Status: None   Collection Time: 12/18/16  3:59 AM  Result Value Ref Range   Glucose-Capillary 89 65 - 99 mg/dL  CBC     Status: Abnormal   Collection Time: 12/18/16  5:57 AM  Result Value Ref Range   WBC 15.3 (H) 4.0 - 10.5 K/uL   RBC 3.48 (L) 4.22 - 5.81 MIL/uL   Hemoglobin 10.5 (L) 13.0 - 17.0 g/dL   HCT 32.0 (L) 39.0 - 52.0 %   MCV 92.0 78.0 - 100.0 fL   MCH 30.2 26.0 - 34.0 pg   MCHC 32.8 30.0 - 36.0 g/dL   RDW 15.0 11.5 - 15.5 %   Platelets 96 (L) 150 - 400 K/uL    Comment: REPEATED TO VERIFY CONSISTENT WITH PREVIOUS RESULT   Basic metabolic panel     Status: Abnormal   Collection Time: 12/18/16  5:57 AM  Result Value Ref Range   Sodium 143 135 - 145 mmol/L   Potassium 3.3 (L) 3.5 - 5.1 mmol/L   Chloride 115 (H) 101 - 111 mmol/L   CO2 20 (L) 22 - 32 mmol/L   Glucose, Bld 108 (H) 65 - 99 mg/dL   BUN 40 (H) 6 - 20 mg/dL   Creatinine, Ser 1.69 (H) 0.61 - 1.24 mg/dL   Calcium 8.1 (L) 8.9 - 10.3 mg/dL   GFR calc non Af Amer 33 (L) >60 mL/min   GFR calc Af Amer 38 (L) >60 mL/min    Comment: (NOTE) The eGFR has been calculated using the CKD EPI equation. This calculation has not been validated in all clinical situations. eGFR's persistently <60 mL/min signify possible Chronic Kidney Disease.    Anion gap 8 5 - 15  CK     Status: Abnormal   Collection Time:  12/18/16  5:57 AM  Result Value Ref Range   Total CK 42 (L) 49 - 397 U/L  Glucose, capillary     Status: None   Collection Time: 12/18/16  8:18 AM  Result Value Ref Range   Glucose-Capillary 91 65 - 99 mg/dL  Heparin level (unfractionated)     Status: Abnormal   Collection Time: 12/18/16  8:30 AM  Result Value Ref Range   Heparin Unfractionated 0.11 (L) 0.30 - 0.70 IU/mL    Comment:        IF HEPARIN RESULTS ARE BELOW EXPECTED VALUES, AND PATIENT DOSAGE HAS BEEN CONFIRMED, SUGGEST FOLLOW UP TESTING OF ANTITHROMBIN III LEVELS.     Current Facility-Administered Medications  Medication Dose Route Frequency Provider Last Rate Last Dose  . albuterol (PROVENTIL) (2.5 MG/3ML) 0.083% nebulizer solution 2.5 mg  2.5 mg Inhalation Q6H PRN Ankit Chirag Amin, MD      . aspirin tablet 325 mg  325 mg Oral Daily Ankit Arsenio Loader, MD   325 mg at 12/17/16 1032  . docusate sodium (COLACE) capsule 100 mg  100 mg Oral BID Ankit Arsenio Loader, MD   100 mg at 12/17/16 2112  . guaiFENesin (MUCINEX) 12 hr tablet 600 mg  600 mg Oral BID Ankit Chirag Amin, MD   600 mg at 12/17/16 2112  . heparin ADULT infusion 100 units/mL (25000 units/240m sodium chloride 0.45%)  1,050 Units/hr Intravenous Continuous Theodis Blaze, MD 10.5 mL/hr at 12/18/16 0057 1,050 Units/hr at 12/18/16 0057  . HYDROcodone-acetaminophen (NORCO/VICODIN) 5-325 MG per tablet 1 tablet  1 tablet Oral Q6H PRN Ankit Arsenio Loader, MD   1 tablet at 12/18/16 0743  . multivitamin with minerals tablet 1 tablet  1 tablet Oral Daily Ankit Arsenio Loader, MD   1 tablet at 12/17/16 1032  . omega-3 acid ethyl esters (LOVAZA) capsule 2 g  2 g Oral Daily Ankit Arsenio Loader, MD   2 g at 12/17/16 1032  . pantoprazole (PROTONIX) EC tablet 40 mg  40 mg Oral Daily Ankit Arsenio Loader, MD   40 mg at 12/17/16 1032  . piperacillin-tazobactam (ZOSYN) IVPB 3.375 g  3.375 g Intravenous Q8H Theodis Blaze, MD   3.375 g at 12/18/16 6734    Musculoskeletal: Strength & Muscle  Tone: decreased Gait & Station: unable to stand Patient leans: N/A  Psychiatric Specialty Exam: Physical Exam as per history and physical   ROS generalized weakness, decreased appetite, increase his sedation probably secondary to new medications, forgetfulness, disturbed appetite and poor nutrition. Patient also have legal blindness and hard hearing bilaterally.  No Fever-chills, No Headache, No changes with Vision or hearing, reports vertigo No problems swallowing food or Liquids, No Chest pain, Cough or Shortness of Breath, No Abdominal pain, No Nausea or Vommitting, Bowel movements are regular, No Blood in stool or Urine, No dysuria, No new skin rashes or bruises, No new joints pains-aches,  No new weakness, tingling, numbness in any extremity, No recent weight gain or loss, No polyuria, polydypsia or polyphagia,   A full 10 point Review of Systems was done, except as stated above, all other Review of Systems were negative.  Blood pressure 138/78, pulse 90, temperature 99 F (37.2 C), temperature source Oral, resp. rate 16, height 5' 5"  (1.651 m), weight 63.1 kg (139 lb 1.8 oz), SpO2 99 %.Body mass index is 23.15 kg/m.  General Appearance: Guarded  Eye Contact:  Good  Speech:  Clear and Coherent and Slow  Volume:  Normal  Mood:  Anxious  Affect:  Appropriate  Thought Process:  Coherent and Goal Directed  Orientation:  Full (Time, Place, and Person)  Thought Content:  WDL  Suicidal Thoughts:  No  Homicidal Thoughts:  No  Memory:  Immediate;   Good Recent;   Fair Remote;   Good  Judgement:  Fair  Insight:  Fair  Psychomotor Activity:  Decreased  Concentration:  Concentration: Fair and Attention Span: Fair  Recall:  Good  Fund of Knowledge:  Good  Language:  Good  Akathisia:  Negative  Handed:  Right  AIMS (if indicated):     Assets:  Communication Skills Desire for Improvement Housing Leisure Time Resilience Social Support Transportation  ADL's:  Impaired   Cognition:  Impaired,  Mild  Sleep:        Treatment Plan Summary: 83 years old male admitted to the hospital with the status post unwitnessed fall, recently decreased functioning secondary to hernia repair and possible pain medication. Patient has mild memory deficits and has failed orientation, concentration and reportedly has stubbornness to get the hearing aid given the required.  Based on my evaluation patient has been suffering with mild cognitive deficits especially memory and considered age-related or lack of stimulation in his environment  Patient needed precautions for fall while being in the hospital.  Patient does not meets criteria for capacity to make his own medical decisions because he  was not able to understand her discussed risk and benefits and also able to take his own medications without assistance.  Patient benefit from physical rehabilitation in a skilled nursing facility for at least 2 weeks to 3 months before going back to his home.  Di sposition: No evidence of imminent risk to self or others at present.   Supportive therapy provided about ongoing stressors.  Ambrose Finland, MD 12/18/2016 10:11 AM

## 2016-12-18 NOTE — Progress Notes (Signed)
PROGRESS NOTE    Travis Bradley  XBJ:[id number] DOB: 1920/03/14 DOA: 12/15/2016 PCP: Romie Jumper, PA-C   Chief Complaint  Patient presents with  . Fall    Brief Narrative:  83 y.o.malewith medical history significant of with bph, possible copd was brought to the ed after unwitnessed fall. History is mostly per the records as patient has baseline dementia therefore unable to participate much in meaningfull hx and unable to get in touch with his daughter at this time.  Apparently patients daughter had been trying to get in touch with him for 2 days and then finally when she went over she found him on the ground therefore brought him to the Ed. Patient has had poor appetite recently and been suffering from progressive worsening of his dementia. Apparently he has a hernia repair surgery about a week ago.  Assessment & Plan   Sepsis secondary to E. Coli Bacteremia -unclear etiology -vancomycin has been stopped and pt currently on Zosyn, continue Zosyn day #3 -plan to narrow down when sensitivity report is back - transition to ampicillin and discharge with amoxicillin 500mg  TID through 3/15 -ID consulted, Dr. Luciana Axe recommends about 10 days of oral antibiotics at discharge depending on sensivities  -plan to repeat blood cultures to assure clearance due to having a mesh -WBC is trending down and no fevers this AM  -keep in SDU as pt is still hypotensive   Hypotension secondary to sepsis  -keep on IVF -monitor vitals in SDU for now  Paroxsymal A-fib with RVR, mild trop elevation  -has been started on Cardizem drip but titration limited due to hypotension. Placed on amiodarone drip and disontinued. -HR this AM has been liable, however, currently in sinus rhythm  -Cardiology consulted and appreciated -Elevated troponin likely from sepsis and Afib -Echocardiogram EF 65-70%, grade 1 diastolic dysfunction -Discussed Anticoagulation with Dr. Excell Seltzer, cardio, recommended 1 month of low dose  AC- given his risk of stroke is highest in the next month and patient will likely go to a SNF -[facility] 3 -Transitioning to oral amiodarone  Unwitnessed fall, pt with known dementia  -pt does not not recall the event -wants to go home -it appears that he is baseline mental status -Psychiatry consulted and appreciated, patient is not able to make his own decisions -Discussed this with the daughter. -PT recommended SNF -Social work consulted for placement  Mild rhabdomyolysis, transaminitis  -rhabdo is likely from fall -Improving, CK 42 -Will repeat LFTs in morning  AKI -no baseline Cr available. Avoid nephrotoxic drugs  -appears to be pre renal -Creatinine upon admisison was 2.64, currently 1.69 -Continue to monitor BMP  Thrombocytopenia -unclear etiology -no signs of bleeding -Platelets improving, currently 96  -Continue to monitor CBC  Hypokalemia -Will replace and continue to monitor BMP  BPH -Continue flomax   Sepsis secondary to E. Coli Bacteremia - unclear etiology - vancomycin has been stopped and pt currently on Zosyn, continue Zosyn day #2 - plan to narrow down when sensitivity report is back  - ID consulted, Dr. Luciana Axe recommends about 10 days of oral antibiotics at discharge depending on sensivities  - plan to repeat blood cultures to assure clearance due to having a mesh - WBC is trending down and no fevers this AM  - keep in SDU as pt is still hypotensive  - called daughter for update and left message to call me back     Hypotension secondary to sepsis  - keep on IVF - monitor vitals in SDU  for now    A-fib with RVR, mild trop elevation  - has been started on Cardizem drip but titration limited due to hypotension - HR this AM in 180's - trop elevated likely from a-fib, pt very clear that he has no chest pain this AM - cardiology consulted, assistance appreciated  - would not check trop unless pt with active chest pain - ECHO pending      Unwitnessed fall, pt with known dementia  - pt does not not recall the event - wants to go home - it appears that he is baseline mental status - I have called his daughter to further discuss - PCT also consulted to address further GOC  - PT/OT eval requested if pt able to participate    Mild rhabdomyolysis, transaminitis  - rhabdo is likely from fall - LFT's up in the setting of rhabdo  - has been on IVF - CK in AM     AKI - no baseline Cr available. Avoid nephrotoxic drugs  - appears to be pre renal - Cr is trending down with IVF - BMP in AM    Thrombocytopenia - unclear etiology - no signs of bleeding - stable in the past 24 hours - CBC in AM    BPH - on flomax     GERD - on PPI -Baseline unknown, currently 10.5 and stable -Continue to monitor CBC  GERD -Continue PPI  Goals of care -Palliative care consulted and appreciated  Malnutrition -Nutrition consulted  DVT Prophylaxis  Eliquis  Code Status: DNR  Family Communication: None at bedside, daughter via phone  Disposition Plan: Admitted, SNF at d/c   Consultants Cardiology Infectious disease Psychiatry   Procedures  Echocardiogram  Antibiotics   Anti-infectives    Start     Dose/Rate Route Frequency Ordered Stop   12/18/16 1115  ampicillin (OMNIPEN) 2 g in sodium chloride 0.9 % 50 mL IVPB     2 g 150 mL/hr over 20 Minutes Intravenous Every 12 hours 12/18/16 1100     12/17/16 2000  vancomycin (VANCOCIN) IVPB 750 mg/150 ml premix  Status:  Discontinued     750 mg 150 mL/hr over 60 Minutes Intravenous Every 48 hours 12/15/16 2034 12/16/16 1713   12/17/16 1300  piperacillin-tazobactam (ZOSYN) IVPB 3.375 g  Status:  Discontinued     3.375 g 12.5 mL/hr over 240 Minutes Intravenous Every 8 hours 12/17/16 0855 12/18/16 1100   12/16/16 0400  piperacillin-tazobactam (ZOSYN) IVPB 2.25 g  Status:  Discontinued     2.25 g 100 mL/hr over 30 Minutes Intravenous Every 8 hours 12/15/16 2034 12/17/16  0855   12/15/16 1930  piperacillin-tazobactam (ZOSYN) IVPB 3.375 g     3.375 g 100 mL/hr over 30 Minutes Intravenous  Once 12/15/16 1924 12/15/16 2010   12/15/16 1930  vancomycin (VANCOCIN) IVPB 1000 mg/200 mL premix     1,000 mg 200 mL/hr over 60 Minutes Intravenous  Once 12/15/16 1924 12/15/16 2253      Subjective:   Travis Bradley seen and examined today.  Complains of back pain. Denies chest pain, shortness of breath, abdominal pain. Wants to go home.   Objective:   Vitals:   12/18/16 0031 12/18/16 0403 12/18/16 0700 12/18/16 1208  BP: 118/61 138/78  (!) 89/56  Pulse: 79 90    Resp: 12 16    Temp: 97.7 F (36.5 C) 99 F (37.2 C) 99 F (37.2 C) 99 F (37.2 C)  TempSrc: Axillary Oral Oral Axillary  SpO2: 95%  99%    Weight:  63.1 kg (139 lb 1.8 oz)    Height:        Intake/Output Summary (Last 24 hours) at 12/18/16 1315 Last data filed at 12/18/16 1210  Gross per 24 hour  Intake           849.46 ml  Output              675 ml  Net           174.46 ml   Filed Weights   12/16/16 0500 12/17/16 0355 12/18/16 0403  Weight: 57 kg (125 lb 10.6 oz) 58.4 kg (128 lb 12 oz) 63.1 kg (139 lb 1.8 oz)    Exam  General: Well developed, thin, elderly male, NAD  HEENT: NCAT, mucous membranes moist.   Cardiovascular: S1 S2 auscultated, no rubs, murmurs or gallops. Regular rate and rhythm.  Respiratory: Clear to auscultation bilaterally with equal chest rise  Abdomen: Soft, nontender, nondistended, + bowel sounds  Extremities: warm dry without cyanosis clubbing or edema  Neuro: AAOx2, nonfocal, very hard of hearing   Psych: Appropriate mood and affect, however lacks insight into medical conditions   Data Reviewed: I have personally reviewed following labs and imaging studies  CBC:  Recent Labs Lab 12/15/16 1712 12/16/16 0202 12/17/16 0223 12/18/16 0557  WBC 8.7 21.3* 16.3* 15.3*  HGB 11.8* 10.2* 11.2* 10.5*  HCT 34.8* 30.8* 34.0* 32.0*  MCV 92.1 91.7 91.9 92.0    PLT 64* 62* 73* 96*   Basic Metabolic Panel:  Recent Labs Lab 12/15/16 1712 12/16/16 0202 12/17/16 0223 12/18/16 0557  NA 141 142 144 143  K 4.1 3.7 3.8 3.3*  CL 108 114* 117* 115*  CO2 19* 19* 19* 20*  GLUCOSE 98 114* 123* 108*  BUN 86* 71* 49* 40*  CREATININE 2.64* 2.21* 1.74* 1.69*  CALCIUM 8.6* 7.6* 8.0* 8.1*  MG  --  2.0  --   --   PHOS  --  3.6  --   --    GFR: Estimated Creatinine Clearance: 23.2 mL/min (by C-G formula based on SCr of 1.69 mg/dL (H)). Liver Function Tests:  Recent Labs Lab 12/16/16 0202  AST 58*  ALT 89*  ALKPHOS 136*  BILITOT 1.7*  PROT 5.5*  ALBUMIN 2.1*   No results for input(s): LIPASE, AMYLASE in the last 168 hours. No results for input(s): AMMONIA in the last 168 hours. Coagulation Profile: No results for input(s): INR, PROTIME in the last 168 hours. Cardiac Enzymes:  Recent Labs Lab 12/15/16 1710 12/16/16 0202 12/16/16 0733 12/16/16 1346 12/18/16 0557  CKTOTAL 539* 363  --   --  42*  TROPONINI  --  0.14* 0.09* 0.08*  --    BNP (last 3 results) No results for input(s): PROBNP in the last 8760 hours. HbA1C: No results for input(s): HGBA1C in the last 72 hours. CBG:  Recent Labs Lab 12/17/16 1614 12/17/16 2159 12/18/16 0359 12/18/16 0818 12/18/16 1205  GLUCAP 127* 132* 89 91 130*   Lipid Profile: No results for input(s): CHOL, HDL, LDLCALC, TRIG, CHOLHDL, LDLDIRECT in the last 72 hours. Thyroid Function Tests:  Recent Labs  12/16/16 0733  TSH 0.914   Anemia Panel: No results for input(s): VITAMINB12, FOLATE, FERRITIN, TIBC, IRON, RETICCTPCT in the last 72 hours. Urine analysis:    Component Value Date/Time   COLORURINE AMBER (A) 12/15/2016 1814   APPEARANCEUR CLOUDY (A) 12/15/2016 1814   LABSPEC 1.019 12/15/2016 1814   PHURINE 5.0 12/15/2016 1814  GLUCOSEU NEGATIVE 12/15/2016 1814   HGBUR LARGE (A) 12/15/2016 1814   BILIRUBINUR SMALL (A) 12/15/2016 1814   KETONESUR NEGATIVE 12/15/2016 1814    PROTEINUR 100 (A) 12/15/2016 1814   NITRITE NEGATIVE 12/15/2016 1814   LEUKOCYTESUR NEGATIVE 12/15/2016 1814   Sepsis Labs: @LABRCNTIP (procalcitonin:4,lacticidven:4)  ) Recent Results (from the past 240 hour(s))  Blood Culture (routine x 2)     Status: Abnormal   Collection Time: 12/15/16  6:05 PM  Result Value Ref Range Status   Specimen Description BLOOD RIGHT ARM  Final   Special Requests   Final    BOTTLES DRAWN AEROBIC AND ANAEROBIC 10CC BOTH BOTTLES   Culture  Setup Time   Final    GRAM NEGATIVE RODS IN BOTH AEROBIC AND ANAEROBIC BOTTLES Organism ID to follow CRITICAL RESULT CALLED TO, READ BACK BY AND VERIFIED WITH: Artelia Laroche, PHARM, 12/16/16 AT 1205 BY J FUDESCO Performed at [facility], [address] [place], [place] [address]    Culture ESCHERICHIA COLI (A)  Final   Report Status 12/18/2016 FINAL  Final   Organism ID, Bacteria ESCHERICHIA COLI  Final      Susceptibility   Escherichia coli - MIC*    AMPICILLIN 4 SENSITIVE Sensitive     CEFAZOLIN <=4 SENSITIVE Sensitive     CEFEPIME <=1 SENSITIVE Sensitive     CEFTAZIDIME <=1 SENSITIVE Sensitive     CEFTRIAXONE <=1 SENSITIVE Sensitive     CIPROFLOXACIN <=0.25 SENSITIVE Sensitive     GENTAMICIN <=1 SENSITIVE Sensitive     IMIPENEM <=0.25 SENSITIVE Sensitive     TRIMETH/SULFA <=20 SENSITIVE Sensitive     AMPICILLIN/SULBACTAM <=2 SENSITIVE Sensitive     PIP/TAZO <=4 SENSITIVE Sensitive     Extended ESBL NEGATIVE Sensitive     * ESCHERICHIA COLI  Blood Culture (routine x 2)     Status: Abnormal   Collection Time: 12/15/16  6:05 PM  Result Value Ref Range Status   Specimen Description BLOOD LEFT ARM  Final   Special Requests   Final    BOTTLES DRAWN AEROBIC AND ANAEROBIC 5CC BOTH BOTTLES   Culture  Setup Time   Final    GRAM NEGATIVE RODS ANAEROBIC BOTTLE ONLY CRITICAL RESULT CALLED TO, READ BACK BY AND VERIFIED WITH: M. TURNER, PHARM, 12/16/16 AT 1205 BY J FUDESCO    Culture (A)  Final    ESCHERICHIA  COLI SUSCEPTIBILITIES PERFORMED ON PREVIOUS CULTURE WITHIN THE LAST 5 DAYS. Performed at [facility], [address] [place], [place] [address]    Report Status 12/18/2016 FINAL  Final  Blood Culture ID Panel (Reflexed)     Status: Abnormal   Collection Time: 12/15/16  6:05 PM  Result Value Ref Range Status   Enterococcus species NOT DETECTED NOT DETECTED Final   Listeria monocytogenes NOT DETECTED NOT DETECTED Final   Staphylococcus species NOT DETECTED NOT DETECTED Final   Staphylococcus aureus NOT DETECTED NOT DETECTED Final   Streptococcus species NOT DETECTED NOT DETECTED Final   Streptococcus agalactiae NOT DETECTED NOT DETECTED Final   Streptococcus pneumoniae NOT DETECTED NOT DETECTED Final   Streptococcus pyogenes NOT DETECTED NOT DETECTED Final   Acinetobacter baumannii NOT DETECTED NOT DETECTED Final   Enterobacteriaceae species DETECTED (A) NOT DETECTED Final    Comment: Enterobacteriaceae represent a large family of gram-negative bacteria, not a single organism. CRITICAL RESULT CALLED TO, READ BACK BY AND VERIFIED WITH: Artelia Laroche, PHARM, 12/16/16 AT 1205 BY J FUDESCO    Enterobacter cloacae complex  NOT DETECTED NOT DETECTED Final   Escherichia coli DETECTED (A) NOT DETECTED Final    Comment: CRITICAL RESULT CALLED TO, READ BACK BY AND VERIFIED WITH: M. TURNER, PHARM, 12/16/16 AT 1205 BY J FUDESCO    Klebsiella oxytoca NOT DETECTED NOT DETECTED Final   Klebsiella pneumoniae NOT DETECTED NOT DETECTED Final   Proteus species NOT DETECTED NOT DETECTED Final   Serratia marcescens NOT DETECTED NOT DETECTED Final   Carbapenem resistance NOT DETECTED NOT DETECTED Final   Haemophilus influenzae NOT DETECTED NOT DETECTED Final   Neisseria meningitidis NOT DETECTED NOT DETECTED Final   Pseudomonas aeruginosa NOT DETECTED NOT DETECTED Final   Candida albicans NOT DETECTED NOT DETECTED Final   Candida glabrata NOT DETECTED NOT DETECTED Final   Candida krusei NOT DETECTED NOT  DETECTED Final   Candida parapsilosis NOT DETECTED NOT DETECTED Final   Candida tropicalis NOT DETECTED NOT DETECTED Final    Comment: Performed at [facility], [address] [place], [place] [address]  Urine culture     Status: Abnormal   Collection Time: 12/15/16  6:14 PM  Result Value Ref Range Status   Specimen Description URINE, CLEAN CATCH  Final   Special Requests NONE  Final   Culture MULTIPLE SPECIES PRESENT, SUGGEST RECOLLECTION (A)  Final   Report Status 12/17/2016 FINAL  Final  MRSA PCR Screening     Status: None   Collection Time: 12/16/16 12:03 AM  Result Value Ref Range Status   MRSA by PCR NEGATIVE NEGATIVE Final    Comment:        The GeneXpert MRSA Assay (FDA approved for NASAL specimens only), is one component of a comprehensive MRSA colonization surveillance program. It is not intended to diagnose MRSA infection nor to guide or monitor treatment for MRSA infections.       Radiology Studies: No results found.   Scheduled Meds: . amiodarone  400 mg Oral BID  . ampicillin (OMNIPEN) IV  2 g Intravenous Q12H  . apixaban  2.5 mg Oral BID  . aspirin  325 mg Oral Daily  . docusate sodium  100 mg Oral BID  . guaiFENesin  600 mg Oral BID  . multivitamin with minerals  1 tablet Oral Daily  . omega-3 acid ethyl esters  2 g Oral Daily  . pantoprazole  40 mg Oral Daily   Continuous Infusions:   LOS: 3 days   Time Spent in minutes   30 minutes  Greidys Deland D.O. on 12/18/2016 at 1:15 PM  Between 7am to 7pm - Pager - [phone]  After 7pm go to [email or web address] - password [email or web address]  And look for the night coverage person covering for me after hours  [facility] Office  [phone]

## 2016-12-18 NOTE — Progress Notes (Signed)
ANTICOAGULATION CONSULT NOTE - Initial Consult  Pharmacy Consult for heparin >> apixaban  Indication: atrial fibrillation  No Known Allergies  Patient Measurements: Height: 5\' 5"  (165.1 cm) Weight: 139 lb 1.8 oz (63.1 kg) IBW/kg (Calculated) : 61.5  Vital Signs: Temp: 99 F (37.2 C) (03/07 1208) Temp Source: Axillary (03/07 1208) BP: 89/56 (03/07 1208) Pulse Rate: 90 (03/07 0403)  Labs:  Recent Labs  12/15/16 1710  12/16/16 0202 12/16/16 0733 12/16/16 1346 12/17/16 0223 12/17/16 2341 12/18/16 0557 12/18/16 0830  HGB  --   < > 10.2*  --   --  11.2*  --  10.5*  --   HCT  --   < > 30.8*  --   --  34.0*  --  32.0*  --   PLT  --   < > 62*  --   --  73*  --  96*  --   HEPARINUNFRC  --   --   --   --   --   --  0.14*  --  0.11*  CREATININE  --   < > 2.21*  --   --  1.74*  --  1.69*  --   CKTOTAL 539*  --  363  --   --   --   --  42*  --   TROPONINI  --   --  0.14* 0.09* 0.08*  --   --   --   --   < > = values in this interval not displayed.  Estimated Creatinine Clearance: 23.2 mL/min (by C-G formula based on SCr of 1.69 mg/dL (H)).   Medical History: Past Medical History:  Diagnosis Date  . COPD (chronic obstructive pulmonary disease) (HCC)   . Stomach ulcer   . Stomach ulcer     Assessment: 83 yo male with new onset atrial fibrillation with RVR in setting of bacteremia. Initially started on heparin infusion but to transition to Apixaban.  Cardiology prefers and pt qualifies lower dose based on age > 6680 and current SCr > 1.5 mg/dL.   Goal of Therapy:  Monitor platelets by anticoagulation protocol: Yes   Plan:  1. Begin Apixaban 2.5 mg BID 2. Educate  Pollyann SamplesAndy Majesty Oehlert, PharmD, BCPS 12/18/2016, 1:00 PM

## 2016-12-18 NOTE — Progress Notes (Signed)
ANTICOAGULATION CONSULT NOTE  Pharmacy Consult for heparin Indication: atrial fibrillation  No Known Allergies  Patient Measurements: Height: 5\' 5"  (165.1 cm) Weight: 139 lb 1.8 oz (63.1 kg) IBW/kg (Calculated) : 61.5  Vital Signs: Temp: 99 F (37.2 C) (03/07 0700) Temp Source: Oral (03/07 0700) BP: 138/78 (03/07 0403) Pulse Rate: 90 (03/07 0403)   Assessment: 83 yo M presents on 3/4 after fall from home. Now has new onset Afib with RVR.   Heparin level remains subtherapeutic this am at 0.11 after a rate increase to heparin infusion. No issues with infusion or sxs of bleeding. Hgb stable and PLTc low but stable.   Goal of Therapy:  Heparin level 0.3-0.7 units/ml Monitor platelets by anticoagulation protocol: Yes   Plan:  1. Increase heparin gtt to 1250 units/hr 2. Check 8 hr heparin level 3. Monitor daily heparin level, CBC, s/s of bleed  Pollyann SamplesAndy Keller Bounds, PharmD, BCPS 12/18/2016, 10:59 AM

## 2016-12-18 NOTE — Evaluation (Signed)
Clinical/Bedside Swallow Evaluation Patient Details  Name: Travis Bradley MRN: [id number] Date of Birth: 02/15/1920  Today's Date: 12/18/2016 Time: SLP Start Time (ACUTE ONLY): 1600 SLP Stop Time (ACUTE ONLY): 1626 SLP Time Calculation (min) (ACUTE ONLY): 26 min  Past Medical History:  Past Medical History:  Diagnosis Date  . COPD (chronic obstructive pulmonary disease) (HCC)   . Stomach ulcer   . Stomach ulcer    Past Surgical History:  Past Surgical History:  Procedure Laterality Date  . HERNIA REPAIR     HPI:  83 y.o.malewith medical history significant of with bph, possible copd was brought to the ed after unwitnessed fall. Dx includes sepsis, hypotension, afib, AKI, mild rhabdo.     Assessment / Plan / Recommendation Clinical Impression  Pt presents with intermittent signs of dysphagia including prolonged mastication with residue/pocketing post-swallow; frequent throat-clearing after consumption of thin liquids;  overt cough during three-oz water test; intermittent belching.  For tonight, recommend modifying diet to mechanical soft with nectar-thick liquids; meds whole in puree.  SLP will f/u next date for MBS>  SLP Visit Diagnosis: Dysphagia, unspecified (R13.10)    Aspiration Risk  Mild aspiration risk    Diet Recommendation   dys3/nectar  Medication Administration: Whole meds with puree    Other  Recommendations Oral Care Recommendations: Oral care BID Other Recommendations: Order thickener from pharmacy   Follow up Recommendations Other (comment) (tba)      Frequency and Duration            Prognosis        Swallow Study   General HPI: 83 y.o.malewith medical history significant of with bph, possible copd was brought to the ed after unwitnessed fall. Dx includes sepsis, hypotension, afib, AKI, mild rhabdo.   Type of Study: Bedside Swallow Evaluation Previous Swallow Assessment: no Diet Prior to this Study: Regular;Thin liquids Temperature Spikes  Noted: No Respiratory Status: Nasal cannula History of Recent Intubation: No Behavior/Cognition: Alert;Cooperative;Pleasant mood Oral Cavity Assessment: Within Functional Limits Oral Care Completed by SLP: No Vision: Functional for self-feeding Self-Feeding Abilities: Able to feed self Patient Positioning: Upright in chair Baseline Vocal Quality: Normal Volitional Cough: Strong    Oral/Motor/Sensory Function Overall Oral Motor/Sensory Function: Within functional limits   Ice Chips Ice chips: Not tested   Thin Liquid Thin Liquid: Impaired Presentation: Cup;Straw Pharyngeal  Phase Impairments: Throat Clearing - Immediate (consistent; cough during three oz water test)    Nectar Thick Nectar Thick Liquid: Within functional limits   Honey Thick Honey Thick Liquid: Not tested   Puree Puree: Within functional limits   Solid   GO   Solid: Impaired Presentation: Self Fed Oral Phase Functional Implications: Oral residue;Prolonged oral transit        Blenda MountsCouture, Travis Bradley 12/18/2016,4:32 PM

## 2016-12-18 NOTE — Progress Notes (Signed)
Got patient out of bed with 2 assist.  He was able to get out of bed and noted that his heart rate is up to 180's. MD notified of this episode.  Informed her that patient was on Amio and Cardizem drip but was not place on any PO med.  Amio tab started as ordered.

## 2016-12-18 NOTE — Progress Notes (Signed)
[facility]   Reason for visit: Follow up on E coli bacteremia  Interval History: pansensitive, no fever, WBC stable.  Still with hip pain.  Daughter at bedside.   Physical Exam: Constitutional:  Vitals:   12/18/16 0403 12/18/16 0700  BP: 138/78   Pulse: 90   Resp: 16   Temp: 99 F (37.2 C) 99 F (37.2 C)   patient appears in NAD Respiratory: Normal respiratory effort; CTA B Cardiovascular: RRR GI: soft, nt, nd MS: left incision c/d/i, no erythema, no tenderness  Review of Systems: Constitutional: negative for fevers and chills Gastrointestinal: negative for diarrhea Integument/breast: negative for rash  Lab Results  Component Value Date   WBC 15.3 (H) 12/18/2016   HGB 10.5 (L) 12/18/2016   HCT 32.0 (L) 12/18/2016   MCV 92.0 12/18/2016   PLT 96 (L) 12/18/2016    Lab Results  Component Value Date   CREATININE 1.69 (H) 12/18/2016   BUN 40 (H) 12/18/2016   NA 143 12/18/2016   K 3.3 (L) 12/18/2016   CL 115 (H) 12/18/2016   CO2 20 (L) 12/18/2016    Lab Results  Component Value Date   ALT 89 (H) 12/16/2016   AST 58 (H) 12/16/2016   ALKPHOS 136 (H) 12/16/2016     Microbiology: Recent Results (from the past 240 hour(s))  Blood Culture (routine x 2)     Status: Abnormal   Collection Time: 12/15/16  6:05 PM  Result Value Ref Range Status   Specimen Description BLOOD RIGHT ARM  Final   Special Requests   Final    BOTTLES DRAWN AEROBIC AND ANAEROBIC 10CC BOTH BOTTLES   Culture  Setup Time   Final    GRAM NEGATIVE RODS IN BOTH AEROBIC AND ANAEROBIC BOTTLES Organism ID to follow CRITICAL RESULT CALLED TO, READ BACK BY AND VERIFIED WITH: Artelia LarocheM. TURNER, PHARM, 12/16/16 AT 1205 BY J FUDESCO Performed at [facility], [address] [place], [place] [address]    Culture ESCHERICHIA COLI (A)  Final   Report Status 12/18/2016 FINAL  Final   Organism ID, Bacteria ESCHERICHIA COLI  Final      Susceptibility   Escherichia coli - MIC*   AMPICILLIN 4 SENSITIVE Sensitive     CEFAZOLIN <=4 SENSITIVE Sensitive     CEFEPIME <=1 SENSITIVE Sensitive     CEFTAZIDIME <=1 SENSITIVE Sensitive     CEFTRIAXONE <=1 SENSITIVE Sensitive     CIPROFLOXACIN <=0.25 SENSITIVE Sensitive     GENTAMICIN <=1 SENSITIVE Sensitive     IMIPENEM <=0.25 SENSITIVE Sensitive     TRIMETH/SULFA <=20 SENSITIVE Sensitive     AMPICILLIN/SULBACTAM <=2 SENSITIVE Sensitive     PIP/TAZO <=4 SENSITIVE Sensitive     Extended ESBL NEGATIVE Sensitive     * ESCHERICHIA COLI  Blood Culture (routine x 2)     Status: Abnormal   Collection Time: 12/15/16  6:05 PM  Result Value Ref Range Status   Specimen Description BLOOD LEFT ARM  Final   Special Requests   Final    BOTTLES DRAWN AEROBIC AND ANAEROBIC 5CC BOTH BOTTLES   Culture  Setup Time   Final    GRAM NEGATIVE RODS ANAEROBIC BOTTLE ONLY CRITICAL RESULT CALLED TO, READ BACK BY AND VERIFIED WITH: M. TURNER, PHARM, 12/16/16 AT 1205 BY J FUDESCO    Culture (A)  Final    ESCHERICHIA COLI SUSCEPTIBILITIES PERFORMED ON PREVIOUS CULTURE WITHIN THE LAST 5 DAYS. Performed at [facility],  [address], [place], [place] [address]    Report Status 12/18/2016 FINAL  Final  Blood Culture ID Panel (Reflexed)     Status: Abnormal   Collection Time: 12/15/16  6:05 PM  Result Value Ref Range Status   Enterococcus species NOT DETECTED NOT DETECTED Final   Listeria monocytogenes NOT DETECTED NOT DETECTED Final   Staphylococcus species NOT DETECTED NOT DETECTED Final   Staphylococcus aureus NOT DETECTED NOT DETECTED Final   Streptococcus species NOT DETECTED NOT DETECTED Final   Streptococcus agalactiae NOT DETECTED NOT DETECTED Final   Streptococcus pneumoniae NOT DETECTED NOT DETECTED Final   Streptococcus pyogenes NOT DETECTED NOT DETECTED Final   Acinetobacter baumannii NOT DETECTED NOT DETECTED Final   Enterobacteriaceae species DETECTED (A) NOT DETECTED Final    Comment: Enterobacteriaceae represent a large  family of gram-negative bacteria, not a single organism. CRITICAL RESULT CALLED TO, READ BACK BY AND VERIFIED WITH: M. TURNER, PHARM, 12/16/16 AT 1205 BY J FUDESCO    Enterobacter cloacae complex NOT DETECTED NOT DETECTED Final   Escherichia coli DETECTED (A) NOT DETECTED Final    Comment: CRITICAL RESULT CALLED TO, READ BACK BY AND VERIFIED WITH: M. TURNER, PHARM, 12/16/16 AT 1205 BY J FUDESCO    Klebsiella oxytoca NOT DETECTED NOT DETECTED Final   Klebsiella pneumoniae NOT DETECTED NOT DETECTED Final   Proteus species NOT DETECTED NOT DETECTED Final   Serratia marcescens NOT DETECTED NOT DETECTED Final   Carbapenem resistance NOT DETECTED NOT DETECTED Final   Haemophilus influenzae NOT DETECTED NOT DETECTED Final   Neisseria meningitidis NOT DETECTED NOT DETECTED Final   Pseudomonas aeruginosa NOT DETECTED NOT DETECTED Final   Candida albicans NOT DETECTED NOT DETECTED Final   Candida glabrata NOT DETECTED NOT DETECTED Final   Candida krusei NOT DETECTED NOT DETECTED Final   Candida parapsilosis NOT DETECTED NOT DETECTED Final   Candida tropicalis NOT DETECTED NOT DETECTED Final    Comment: Performed at [facility], [address], [place], [place] [address]  Urine culture     Status: Abnormal   Collection Time: 12/15/16  6:14 PM  Result Value Ref Range Status   Specimen Description URINE, CLEAN CATCH  Final   Special Requests NONE  Final   Culture MULTIPLE SPECIES PRESENT, SUGGEST RECOLLECTION (A)  Final   Report Status 12/17/2016 FINAL  Final  MRSA PCR Screening     Status: None   Collection Time: 12/16/16 12:03 AM  Result Value Ref Range Status   MRSA by PCR NEGATIVE NEGATIVE Final    Comment:        The GeneXpert MRSA Assay (FDA approved for NASAL specimens only), is one component of a comprehensive MRSA colonization surveillance program. It is not intended to diagnose MRSA infection nor to guide or monitor treatment for MRSA infections.     Impression/Plan:    1. E coli bacteremia - will narrow to ampicillin.  Appreciate pharmacy dose adjustment.  Can go out on amoxicillin 500 mg tid through March 15th.  I discussed with the daughter issues to monitor if his groin at the incision site becomes red, more painful, to have him evaluated by the surgeon.   2. Renal insufficiency - as above, CC about 23 so dose adjustment made.    I will sign off, thanks for consult

## 2016-12-18 NOTE — Evaluation (Signed)
Occupational Therapy Evaluation Patient Details Name: Travis Bradley MRN: [id number] DOB: 11/15/1919 Today's Date: 12/18/2016    History of Present Illness 83 y.o.maleadmitted following an unwitnessed fall at home. X-rays negative for any orthopedic injury but noted to have atrial fibrillation, hypotension, and dehydration. PMH: dementia, COPD, recent hernia repair (approx. 1 week prior to admission).    Clinical Impression   Pt was independent in self care and ambulation prior to admission. Presents with impaired vision and cognition, generalized weakness and poor standing balance. Pt requires min to max assist with ADL and min assist with second person for safety for mobility using a RW. Pt will need post acute rehab in SNF. Will follow.    Follow Up Recommendations  SNF;Supervision/Assistance - 24 hour    Equipment Recommendations       Recommendations for Other Services       Precautions / Restrictions Precautions Precautions: Fall Restrictions Weight Bearing Restrictions: No      Mobility Bed Mobility              General bed mobility comments: pt sitting OOB in recliner  Transfers Overall transfer level: Needs assistance Equipment used: Rolling walker (2 wheeled);None Transfers: Sit to/from Ray[employer]onStand;Stand Pivot Transfers Sit to Stand: Min assist;+2 safety/equipment Stand pivot transfers: Min assist;+2 safety/equipment       General transfer comment: pt required multiple verbal and tactile cues for sequencing and bilateral hand placement. Min A x2 for stability with rise from chair and BSC and with pivotal movements between the two    Balance Overall balance assessment: Needs assistance Sitting-balance support: Feet supported Sitting balance-Leahy Scale: Fair     Standing balance support: Bilateral upper extremity supported Standing balance-Leahy Scale: Poor Standing balance comment: pt reliant on bilateral UEs on RW                             ADL Overall ADL's : Needs assistance/impaired Eating/Feeding: Set up;Sitting Eating/Feeding Details (indicate cue type and reason): assist to open containers Grooming: Wash/dry hands;Sitting   Upper Body Bathing: Minimal assistance;Sitting   Lower Body Bathing: Maximal assistance;Sit to/from stand   Upper Body Dressing : Moderate assistance;Sitting   Lower Body Dressing: Maximal assistance;Sit to/from stand   Toilet Transfer: +2 for physical assistance;Minimal assistance;Stand-pivot;RW;BSC   Toileting- Clothing Manipulation and Hygiene: Moderate assistance;Sit to/from stand Toileting - Clothing Manipulation Details (indicate cue type and reason): assist for thoroughness             Vision Baseline Vision/History: Macular Degeneration Patient Visual Report: No change from baseline Additional Comments: wears high powered readers     Perception     Praxis      Pertinent Vitals/Pain Pain Assessment: No/denies pain     Hand Dominance Right   Extremity/Trunk Assessment Upper Extremity Assessment Upper Extremity Assessment: Overall WFL for tasks assessed   Lower Extremity Assessment Lower Extremity Assessment: Defer to PT evaluation   Cervical / Trunk Assessment Cervical / Trunk Assessment: Kyphotic   Communication Communication Communication: HOH   Cognition Arousal/Alertness: Awake/alert Behavior During Therapy: WFL for tasks assessed/performed Overall Cognitive Status: Impaired/Different from baseline Area of Impairment: Attention;Memory;Following commands;Safety/judgement;Problem solving;Awareness Orientation Level: Disoriented to;Time   Memory: Decreased short-term memory Following Commands: Follows one step commands with increased time Safety/Judgement: Decreased awareness of safety;Decreased awareness of deficits   Problem Solving: Slow processing;Decreased initiation;Difficulty sequencing;Requires verbal cues;Requires tactile cues General Comments:  pt's daughter reports pt does not have a dx of  dementia   General Comments       Exercises       Shoulder Instructions      Home Living Family/patient expects to be discharged to:: Skilled nursing facility Living Arrangements: Alone                           Home Equipment: None          Prior Functioning/Environment Level of Independence: Needs assistance  Gait / Transfers Assistance Needed: walked without a device   ADL's / Homemaking Assistance Needed: independent in ADL, meal prep and bill paying, daughter helped with transportation Communication / Swallowing Assistance Needed: HOH, refuses hearing aid Comments: per daughter        OT Problem List: Decreased strength;Decreased activity tolerance;Impaired balance (sitting and/or standing);Decreased cognition;Decreased safety awareness;Decreased knowledge of use of DME or AE;Impaired vision/perception      OT Treatment/Interventions: Self-care/ADL training;Energy conservation;DME and/or AE instruction;Patient/family education;Other (comment)    OT Goals(Current goals can be found in the care plan section) Acute Rehab OT Goals Patient Stated Goal: return home OT Goal Formulation: With patient Time For Goal Achievement: 01/01/17 Potential to Achieve Goals: Good ADL Goals Pt Will Perform Grooming: with min assist;standing (2 activities) Pt Will Perform Upper Body Dressing: with min assist;sitting Pt Will Perform Lower Body Dressing: sit to/from stand;with mod assist Pt Will Transfer to Toilet: ambulating;with min assist;bedside commode (over toilet) Pt Will Perform Toileting - Clothing Manipulation and hygiene: sit to/from stand;with min guard assist  OT Frequency: Min 2X/week   Barriers to D/C:            Co-evaluation   Reason for Co-Treatment: Necessary to address cognition/behavior during functional activity;For patient/therapist safety;To address functional/ADL transfers PT goals addressed during  session: Mobility/safety with mobility;Balance;Proper use of DME;Strengthening/ROM        End of Session Equipment Utilized During Treatment: Gait belt;Rolling walker;Oxygen Nurse Communication: Mobility status  Activity Tolerance: Patient tolerated treatment well Patient left: in chair;with call bell/phone within reach;with chair alarm set  OT Visit Diagnosis: Unsteadiness on feet (R26.81);History of falling (Z91.81);Other symptoms and signs involving cognitive function                ADL either performed or assessed with clinical judgement  Time: 1415-1532 OT Time Calculation (min): 77 min Charges:  OT General Charges $OT Visit: 1 Procedure OT Evaluation $OT Eval Moderate Complexity: 1 Procedure OT Treatments $Self Care/Home Management : 23-37 mins G-Codes:      Evern Bio 12/18/2016, 4:25 PM  [phone]

## 2016-12-18 NOTE — Progress Notes (Signed)
ANTICOAGULATION CONSULT NOTE  Pharmacy Consult for Heparin Indication: atrial fibrillation  No Known Allergies  Patient Measurements: Height: 5\' 5"  (165.1 cm) Weight: 128 lb 12 oz (58.4 kg) IBW/kg (Calculated) : 61.5  Vital Signs: Temp: 97.7 F (36.5 C) (03/07 0031) Temp Source: Axillary (03/07 0031) BP: 110/66 (03/07 0031) Pulse Rate: 79 (03/07 0031)  Labs:  Recent Labs  12/15/16 1710  12/15/16 1712 12/16/16 0202 12/16/16 0733 12/16/16 1346 12/17/16 0223 12/17/16 2341  HGB  --   < > 11.8* 10.2*  --   --  11.2*  --   HCT  --   --  34.8* 30.8*  --   --  34.0*  --   PLT  --   --  64* 62*  --   --  73*  --   HEPARINUNFRC  --   --   --   --   --   --   --  0.14*  CREATININE  --   --  2.64* 2.21*  --   --  1.74*  --   CKTOTAL 539*  --   --  363  --   --   --   --   TROPONINI  --   --   --  0.14* 0.09* 0.08*  --   --   < > = values in this interval not displayed.  Estimated Creatinine Clearance: 21.4 mL/min (by C-G formula based on SCr of 1.74 mg/dL (H)).  Assessment: 83 y.o. male with Afib for heparin  Goal of Therapy:  Heparin level 0.3-0.7 units/ml Monitor platelets by anticoagulation protocol: Yes   Plan:  Increase Heparin 1050 units/hr Check heparin level in 8 hours.   Travis Bradley, Travis Bradley 12/18/2016,12:41 AM

## 2016-12-18 NOTE — Progress Notes (Signed)
Physical Therapy Treatment Patient Details Name: Travis Bradley MRN: [id number] DOB: January 08, 1920 Today's Date: 12/18/2016    History of Present Illness 83 y.o.maleadmitted following an unwitnessed fall at home. X-rays negative for any orthopedic injury but noted to have atrial fibrillation, hypotension, and dehydration. PMH: dementia, COPD, recent hernia repair (approx. 1 week prior to admission).     PT Comments    Pt making improvements with mobility but continues to require physical assistance for safety with transfers. With activity, pt's HR remained stable (mid 90's to low 100's). All other VSS as well. PT continuing to recommend pt d/c to SNF.     Follow Up Recommendations  SNF;Supervision for mobility/OOB     Equipment Recommendations  Rolling walker with 5" wheels    Recommendations for Other Services       Precautions / Restrictions Precautions Precautions: Fall Restrictions Weight Bearing Restrictions: No    Mobility  Bed Mobility Overal bed mobility: Needs Assistance             General bed mobility comments: pt sitting OOB in recliner when PT entered room  Transfers Overall transfer level: Needs assistance Equipment used: Rolling walker (2 wheeled);None Transfers: Sit to/from [employer] to Stand: Min assist;+2 safety/equipment Stand pivot transfers: Min assist;+2 safety/equipment       General transfer comment: pt required multiple verbal and tactile cues for sequencing and bilateral hand placement. Min A x2 for stability with rise from chair and BSC and with pivotal movements between the two  Ambulation/Gait Ambulation/Gait assistance: Min assist;+2 safety/equipment Ambulation Distance (Feet): 2 Feet Assistive device: Rolling walker (2 wheeled) Gait Pattern/deviations: Shuffle;Trunk flexed;Decreased stride length Gait velocity: decreased Gait velocity interpretation: Below normal speed for age/gender General Gait Details:  pt required constant min A and verbal/tactile cues for safety with RW    Stairs            Wheelchair Mobility    Modified Rankin (Stroke Patients Only)       Balance Overall balance assessment: Needs assistance Sitting-balance support: Feet supported Sitting balance-Leahy Scale: Fair     Standing balance support: Bilateral upper extremity supported Standing balance-Leahy Scale: Poor Standing balance comment: pt reliant on bilateral UEs on RW                    Cognition Arousal/Alertness: Awake/alert Behavior During Therapy: WFL for tasks assessed/performed Overall Cognitive Status: Impaired/Different from baseline Area of Impairment: Attention;Memory;Following commands;Safety/judgement;Problem solving;Awareness Orientation Level: Disoriented to;Time   Memory: Decreased short-term memory Following Commands: Follows one step commands with increased time Safety/Judgement: Decreased awareness of safety;Decreased awareness of deficits   Problem Solving: Slow processing;Decreased initiation;Difficulty sequencing;Requires verbal cues;Requires tactile cues General Comments: pt's daughter reports pt does not have a dx of dementia    Exercises      General Comments        Pertinent Vitals/Pain Pain Assessment: No/denies pain    Home Living Family/patient expects to be discharged to:: Skilled nursing facility Living Arrangements: Alone                  Prior Function Level of Independence: Needs assistance  Gait / Transfers Assistance Needed: walked without a device   ADL's / Homemaking Assistance Needed: independent in ADL, meal prep and bill paying, daughter helped with transportation Comments: per daughter   PT Goals (current goals can now be found in the care plan section) Acute Rehab PT Goals Patient Stated Goal: return home PT Goal Formulation: With  patient Time For Goal Achievement: 12/30/16 Potential to Achieve Goals: Fair Progress  towards PT goals: Progressing toward goals    Frequency    Min 3X/week      PT Plan Current plan remains appropriate;Frequency needs to be updated    Co-evaluation PT/OT/SLP Co-Evaluation/Treatment: Yes Reason for Co-Treatment: Necessary to address cognition/behavior during functional activity;For patient/therapist safety;To address functional/ADL transfers PT goals addressed during session: Mobility/safety with mobility;Balance;Proper use of DME;Strengthening/ROM       End of Session Equipment Utilized During Treatment: Gait belt;Oxygen Activity Tolerance: Patient limited by fatigue Patient left: in chair;with call bell/phone within reach;with chair alarm set Nurse Communication: Mobility status PT Visit Diagnosis: Unsteadiness on feet (R26.81);Muscle weakness (generalized) (M62.81);History of falling (Z91.81)     Time: [id number] PT Time Calculation (min) (ACUTE ONLY): 61 min  Charges:  $Therapeutic Activity: 38-52 mins                    G CodesAlessandra Bevels:       Seung Nidiffer M Jacayla Nordell 12/18/2016, 4:19 PM Deborah ChalkJennifer Kaedyn Belardo, PT, DPT [phone]

## 2016-12-18 NOTE — Progress Notes (Signed)
PHARMACY NOTE:  ANTIMICROBIAL RENAL DOSAGE ADJUSTMENT  Current antimicrobial regimen includes a mismatch between antimicrobial dosage and estimated renal function.  As per policy approved by the Pharmacy & Therapeutics and Medical Executive Committees, the antimicrobial dosage will be adjusted accordingly.  Current antimicrobial dosage:  Ampicillin 1 gram IV every 6 hours  Indication: E.coli bacteremia   Renal Function:  Estimated Creatinine Clearance: 23.2 mL/min (by C-G formula based on SCr of 1.69 mg/dL (H)). []      On intermittent HD, scheduled: []      On CRRT    Antimicrobial dosage has been changed to:  Ampicillin 2 gram IV every 12 hours   Additional comments:   Thank you for allowing pharmacy to be a part of this patient's care.  Pollyann SamplesAndy Mala Gibbard, PharmD, BCPS 12/18/2016, 11:06 AM

## 2016-12-18 NOTE — Progress Notes (Signed)
Progress Note  [name] Date of Encounter: 12/18/2016  Primary Cardiologist: new  Subjective   No complaints. No CP or shortness of breath. Poor historian  Inpatient Medications    Scheduled Meds: . ampicillin (OMNIPEN) IV  2 g Intravenous Q12H  . aspirin  325 mg Oral Daily  . docusate sodium  100 mg Oral BID  . guaiFENesin  600 mg Oral BID  . multivitamin with minerals  1 tablet Oral Daily  . omega-3 acid ethyl esters  2 g Oral Daily  . pantoprazole  40 mg Oral Daily   Continuous Infusions: . heparin 1,250 Units/hr (12/18/16 1137)   PRN Meds: albuterol, HYDROcodone-acetaminophen   Vital Signs    Vitals:   12/18/16 0031 12/18/16 0403 12/18/16 0700 12/18/16 1208  BP: 118/61 138/78  (!) 89/56  Pulse: 79 90    Resp: 12 16    Temp: 97.7 F (36.5 C) 99 F (37.2 C) 99 F (37.2 C) 99 F (37.2 C)  TempSrc: Axillary Oral Oral Axillary  SpO2: 95% 99%    Weight:  139 lb 1.8 oz (63.1 kg)    Height:        Intake/Output Summary (Last 24 hours) at 12/18/16 1234 Last data filed at 12/18/16 1210  Gross per 24 hour  Intake           849.46 ml  Output              875 ml  Net           -25.54 ml   Filed Weights   12/16/16 0500 12/17/16 0355 12/18/16 0403  Weight: 125 lb 10.6 oz (57 kg) 128 lb 12 oz (58.4 kg) 139 lb 1.8 oz (63.1 kg)    Telemetry    Sinus rhythm --->AF with RVR ---> sinus rhythm - Personally Reviewed   Physical Exam  Elderly, confused but pleasant male GEN: No acute distress.   Neck: No JVD Cardiac: RRR, no murmurs, rubs, or gallops.  Respiratory: Clear to auscultation bilaterally. GI: Soft, nontender, non-distended  MS: No edema; No deformity. Neuro:  Nonfocal  Psych: Normal affect   Labs    Chemistry Recent Labs Lab 12/16/16 0202 12/17/16 0223 12/18/16 0557  NA 142 144 143  K 3.7 3.8 3.3*  CL 114* 117* 115*  CO2 19* 19* 20*  GLUCOSE 114* 123* 108*  BUN 71* 49* 40*  CREATININE 2.21* 1.74* 1.69*  CALCIUM 7.6*  8.0* 8.1*  PROT 5.5*  --   --   ALBUMIN 2.1*  --   --   AST 58*  --   --   ALT 89*  --   --   ALKPHOS 136*  --   --   BILITOT 1.7*  --   --   GFRNONAA 24* 32* 33*  GFRAA 28* 37* 38*  ANIONGAP 9 8 8      Hematology Recent Labs Lab 12/16/16 0202 12/17/16 0223 12/18/16 0557  WBC 21.3* 16.3* 15.3*  RBC 3.36* 3.70* 3.48*  HGB 10.2* 11.2* 10.5*  HCT 30.8* 34.0* 32.0*  MCV 91.7 91.9 92.0  MCH 30.4 30.3 30.2  MCHC 33.1 32.9 32.8  RDW 14.2 14.6 15.0  PLT 62* 73* 96*    Cardiac Enzymes Recent Labs Lab 12/16/16 0202 12/16/16 0733 12/16/16 1346  TROPONINI 0.14* 0.09* 0.08*   No results for input(s): TROPIPOC in the last 168 hours.   BNPNo results for input(s): BNP, PROBNP in the last 168 hours.   DDimer  No results for input(s): DDIMER in the last 168 hours.   Radiology    No results found.  Cardiac Studies   2D Echo: Study Conclusions  - Left ventricle: The cavity size was normal. Systolic function was   vigorous. The estimated ejection fraction was in the range of 65%   to 70%. Wall motion was normal; there were no regional wall   motion abnormalities. Doppler parameters are consistent with   abnormal left ventricular relaxation (grade 1 diastolic   dysfunction). - Atrial septum: No defect or patent foramen ovale was identified.  Patient Profile     83 y.o. male with atrial fibrillation with RVR in the setting of bacteremia/sepsis  Assessment & Plan    Atrial fibrillation with RVR in this elderly gentleman: discussed issues at length with the patient's daughter at the bedside. Also with Dr Catha Gosselin. Difficult decision re: anticoagulation because of his advanced age. CHADS-Vasc = 3. He is at highest risk of stroke in first month and now has been chemically cardioverted with amiodarone. Seems reasonable to treat him with dose-reduced Eliquis x one month, then likely stop Eliquis to avoid long-term risk of bleeding. Will convert from IV to oral amiodarone. He does  not tolerate atrial fibrillation well and has been hypotensive every time he goes into AF with RVR.  Enzo Bi, MD  12/18/2016, 12:34 PM

## 2016-12-19 ENCOUNTER — Inpatient Hospital Stay (HOSPITAL_COMMUNITY): Payer: Medicare Other

## 2016-12-19 DIAGNOSIS — Z7189 Other specified counseling: Secondary | ICD-10-CM

## 2016-12-19 DIAGNOSIS — A4151 Sepsis due to Escherichia coli [E. coli]: Principal | ICD-10-CM

## 2016-12-19 DIAGNOSIS — W19XXXD Unspecified fall, subsequent encounter: Secondary | ICD-10-CM

## 2016-12-19 LAB — CBC
HCT: 31.9 % — ABNORMAL LOW (ref 39.0–52.0)
Hemoglobin: 10.5 g/dL — ABNORMAL LOW (ref 13.0–17.0)
MCH: 30.1 pg (ref 26.0–34.0)
MCHC: 32.9 g/dL (ref 30.0–36.0)
MCV: 91.4 fL (ref 78.0–100.0)
PLATELETS: 123 10*3/uL — AB (ref 150–400)
RBC: 3.49 MIL/uL — AB (ref 4.22–5.81)
RDW: 15.3 % (ref 11.5–15.5)
WBC: 13.8 10*3/uL — ABNORMAL HIGH (ref 4.0–10.5)

## 2016-12-19 LAB — GLUCOSE, CAPILLARY
GLUCOSE-CAPILLARY: 108 mg/dL — AB (ref 65–99)
GLUCOSE-CAPILLARY: 130 mg/dL — AB (ref 65–99)
GLUCOSE-CAPILLARY: 121 mg/dL — AB (ref 65–99)
GLUCOSE-CAPILLARY: 112 mg/dL — AB (ref 65–99)

## 2016-12-19 LAB — COMPREHENSIVE METABOLIC PANEL
ALT: 52 U/L (ref 17–63)
ANION GAP: 6 (ref 5–15)
AST: 69 U/L — ABNORMAL HIGH (ref 15–41)
Albumin: 1.9 g/dL — ABNORMAL LOW (ref 3.5–5.0)
Alkaline Phosphatase: 113 U/L (ref 38–126)
BUN: 31 mg/dL — ABNORMAL HIGH (ref 6–20)
CHLORIDE: 115 mmol/L — AB (ref 101–111)
CO2: 18 mmol/L — AB (ref 22–32)
CREATININE: 1.52 mg/dL — AB (ref 0.61–1.24)
Calcium: 7.8 mg/dL — ABNORMAL LOW (ref 8.9–10.3)
GFR, EST AFRICAN AMERICAN: 43 mL/min — AB (ref >60)
GFR, EST NON AFRICAN AMERICAN: 37 mL/min — AB (ref >60)
Glucose, Bld: 121 mg/dL — ABNORMAL HIGH (ref 65–99)
POTASSIUM: 6.2 mmol/L — AB (ref 3.5–5.1)
SODIUM: 139 mmol/L (ref 135–145)
Total Bilirubin: 3 mg/dL — ABNORMAL HIGH (ref 0.3–1.2)
Total Protein: 5.8 g/dL — ABNORMAL LOW (ref 6.5–8.1)

## 2016-12-19 LAB — POTASSIUM: POTASSIUM: 3.2 mmol/L — AB (ref 3.5–5.1)

## 2016-12-19 MED ORDER — ENSURE ENLIVE PO LIQD
237.0000 mL | Freq: Two times a day (BID) | ORAL | Status: DC
  Administered 2016-12-19 – 2016-12-20 (×2): 237 mL via ORAL

## 2016-12-19 MED ORDER — AMIODARONE HCL IN DEXTROSE 360-4.14 MG/200ML-% IV SOLN
60.0000 mg/h | INTRAVENOUS | Status: AC
  Administered 2016-12-19 (×2): 60 mg/h via INTRAVENOUS
  Filled 2016-12-19 (×2): qty 200

## 2016-12-19 MED ORDER — POTASSIUM CHLORIDE CRYS ER 20 MEQ PO TBCR
40.0000 meq | EXTENDED_RELEASE_TABLET | Freq: Once | ORAL | Status: AC
  Administered 2016-12-19: 40 meq via ORAL
  Filled 2016-12-19: qty 2

## 2016-12-19 MED ORDER — AMIODARONE HCL IN DEXTROSE 360-4.14 MG/200ML-% IV SOLN
30.0000 mg/h | INTRAVENOUS | Status: DC
  Administered 2016-12-19: 30 mg/h via INTRAVENOUS

## 2016-12-19 MED ORDER — AMIODARONE HCL 200 MG PO TABS
400.0000 mg | ORAL_TABLET | Freq: Two times a day (BID) | ORAL | Status: DC
  Administered 2016-12-19 – 2016-12-20 (×3): 400 mg via ORAL
  Filled 2016-12-19 (×3): qty 2

## 2016-12-19 NOTE — Care Management Important Message (Signed)
Important Message  Patient Details  Name: Travis Bradley MRN: [id number] Date of Birth: 02/07/1920   Medicare Important Message Given:  Yes    Kyla BalzarineShealy, Audray Rumore Abena 12/19/2016, 3:24 PM

## 2016-12-19 NOTE — Progress Notes (Addendum)
Progress Note  [name] Date of Encounter: 12/19/2016  Primary Cardiologist: new, Dr Excell Seltzerooper  Subjective   No complaints. No shortness of breath. Poor historian. Worries that streaks of pain from his L abdomen to his neck are his heart. Wants to prevent an MI.   No awareness of the arrhythmia.  Inpatient Medications    Scheduled Meds: . ampicillin (OMNIPEN) IV  2 g Intravenous Q12H  . apixaban  2.5 mg Oral BID  . aspirin  325 mg Oral Daily  . docusate sodium  100 mg Oral BID  . feeding supplement (ENSURE ENLIVE)  237 mL Oral BID BM  . guaiFENesin  600 mg Oral BID  . multivitamin with minerals  1 tablet Oral Daily  . omega-3 acid ethyl esters  2 g Oral Daily  . pantoprazole  40 mg Oral Daily  . potassium chloride  40 mEq Oral Once  . RESOURCE THICKENUP CLEAR   Oral Once   Continuous Infusions: . amiodarone 30 mg/hr (12/19/16 0628)   PRN Meds: albuterol, HYDROcodone-acetaminophen   Vital Signs    Vitals:   12/19/16 0600 12/19/16 0700 12/19/16 0800 12/19/16 1224  BP: 94/60 116/62 135/78   Pulse: 89 84 89   Resp: 20     Temp:   98 F (36.7 C) 98 F (36.7 C)  TempSrc:   Oral Axillary  SpO2: 92% 92% 96%   Weight:      Height:        Intake/Output Summary (Last 24 hours) at 12/19/16 1240 Last data filed at 12/19/16 1224  Gross per 24 hour  Intake           282.02 ml  Output              625 ml  Net          -342.98 ml   Filed Weights   12/17/16 0355 12/18/16 0403 12/19/16 0448  Weight: 128 lb 12 oz (58.4 kg) 139 lb 1.8 oz (63.1 kg) 135 lb 12.9 oz (61.6 kg)    Telemetry    Sinus rhythm --->AF with RVR ---> sinus rhythm since about 3 am today - Personally Reviewed   Physical Exam  Elderly, confused but pleasant male GEN: No acute distress.   Neck:  JVD 8 cm Cardiac: RRR, no murmurs, rubs, or gallops.  Respiratory: decreased BS bases w/ few rales bilaterally. GI: Soft, nontender, non-distended  MS: No edema; No deformity.  Ecchymosis to the backs of his hands Neuro:  Nonfocal  Psych: Normal affect   Labs    Chemistry  Recent Labs Lab 12/16/16 0202 12/17/16 0223 12/18/16 0557 12/19/16 0256 12/19/16 0825  NA 142 144 143 139  --   K 3.7 3.8 3.3* 6.2* 3.2*  CL 114* 117* 115* 115*  --   CO2 19* 19* 20* 18*  --   GLUCOSE 114* 123* 108* 121*  --   BUN 71* 49* 40* 31*  --   CREATININE 2.21* 1.74* 1.69* 1.52*  --   CALCIUM 7.6* 8.0* 8.1* 7.8*  --   PROT 5.5*  --   --  5.8*  --   ALBUMIN 2.1*  --   --  1.9*  --   AST 58*  --   --  69*  --   ALT 89*  --   --  52  --   ALKPHOS 136*  --   --  113  --   BILITOT 1.7*  --   --  3.0*  --   GFRNONAA 24* 32* 33* 37*  --   GFRAA 28* 37* 38* 43*  --   ANIONGAP 9 8 8 6   --      Hematology  Recent Labs Lab 12/17/16 0223 12/18/16 0557 12/19/16 0256  WBC 16.3* 15.3* 13.8*  RBC 3.70* 3.48* 3.49*  HGB 11.2* 10.5* 10.5*  HCT 34.0* 32.0* 31.9*  MCV 91.9 92.0 91.4  MCH 30.3 30.2 30.1  MCHC 32.9 32.8 32.9  RDW 14.6 15.0 15.3  PLT 73* 96* 123*    Cardiac Enzymes  Recent Labs Lab 12/16/16 0202 12/16/16 0733 12/16/16 1346  TROPONINI 0.14* 0.09* 0.08*    Radiology    No results found.  Cardiac Studies   2D Echo: Study Conclusions - Left ventricle: The cavity size was normal. Systolic function was   vigorous. The estimated ejection fraction was in the range of 65%   to 70%. Wall motion was normal; there were no regional wall   motion abnormalities. Doppler parameters are consistent with   abnormal left ventricular relaxation (grade 1 diastolic   dysfunction). - Atrial septum: No defect or patent foramen ovale was identified.  Patient Profile     83 y.o. male with atrial fibrillation with RVR in the setting of bacteremia/sepsis  Assessment & Plan    Atrial fibrillation with RVR in this elderly gentleman: discussed issues at length with the patient's daughter at the bedside. Also with Dr Catha Gosselin.  - Difficult decision re: anticoagulation  because of his advanced age. CHADS-Vasc = 3.  - He is at highest risk of stroke in first month and now has been chemically cardioverted with amiodarone.  - Seems reasonable to treat him with dose-reduced Eliquis x one month, then likely stop Eliquis to avoid long-term risk of bleeding. Will convert from IV to oral amiodarone.  - He does not tolerate atrial fibrillation well and has been hypotensive every time he goes into AF with RVR. When in afib, HR is never normal. - 40 meq Kdur ordered for K+ 3.3 on 03/07 but K+ now 3.2>>give 40 meq bid today  Otherwise, per IM Principal Problem:   Other frontotemporal dementia Active Problems:   Hypotension   Dehydration   Atrial fibrillation with RVR (HCC)   Fall   Dementia   Sepsis (HCC)   Bacteremia   Goals of care, counseling/discussion   Palliative care encounter  Tawny Asal  12/19/2016, 12:40 PM    Patient seen, examined. Available data reviewed. Agree with findings, assessment, and plan as outlined by Theodore Demark, PA-C. Elderly male in NAD, lungs CTA, CV: RRR no murmur, extremities: no edema. Tele: sinus rhythm, no further afib.   Recommend: Eliquis x one month Amiodarone 400 mg BID x 7 days, then 200 mg daily FU AFib clinic 3-4 weeks Please call with questions  Tonny Bollman, M.D. 12/19/2016 4:08 PM

## 2016-12-19 NOTE — Progress Notes (Signed)
Initial Nutrition Assessment  DOCUMENTATION CODES:   Not applicable  INTERVENTION:    Ensure Enlive po BID, each supplement provides 350 kcal and 20 grams of protein  NUTRITION DIAGNOSIS:   Increased nutrient needs related to acute illness as evidenced by estimated needs  GOAL:   Patient will meet greater than or equal to 90% of their needs  MONITOR:   Supplement acceptance, PO intake, Labs, Weight trends, I & O's  REASON FOR ASSESSMENT:   Consult Assessment of nutrition requirement/status  ASSESSMENT:   [name] with medical history significant of with bph, possible copd was brought to the ed after unwitnessed fall.   Pt currently in DIAGNOSTIC RADIOLOGY. RD unable to obtain nutrition hx or complete Nutrition Focused Physical Exam at this time. Per chart review, pt has poor appetite with worsening dementia PTA.  S/p bedside swallow evaluation 3/7 >> Speech Path rec Dys 3-thin liquid diet. Labs reviewed; Potassium 3.2 (L).  Medications reviewed; MVI daily. CBG's [id number].  RD suspects possible malnutrition, however, unable to identify at this time.  Diet Order:  DIET DYS 3 Room service appropriate? Yes; Fluid consistency: Thin  Skin:  Reviewed, no issues  Last BM:  3/8  Height:   Ht Readings from Last 1 Encounters:  12/15/16 5\' 5"  (1.651 m)    Weight:   Wt Readings from Last 1 Encounters:  12/19/16 135 lb 12.9 oz (61.6 kg)    Ideal Body Weight:  56.8 kg  BMI:  Body mass index is 22.6 kg/m.  Estimated Nutritional Needs:   Kcal:  1500-1700  Protein:  70-80 gm  Fluid:  1.5-1.7 L  EDUCATION NEEDS:   No education needs identified at this time  Maureen ChattersKatie Bobbyjo Marulanda, RD, LDN Pager #: [phone] After-Hours Pager #: [phone]

## 2016-12-19 NOTE — Progress Notes (Signed)
PROGRESS NOTE    Travis Bradley  ZOX:[id number] DOB: 19-May-1920 DOA: 12/15/2016 PCP: Romie Jumper, PA-C   Chief Complaint  Patient presents with  . Fall    Brief Narrative:  83 y.o.malewith medical history significant of with bph, possible copd was brought to the ed after unwitnessed fall. History is mostly per the records as patient has baseline dementia therefore unable to participate much in meaningfull hx and unable to get in touch with his daughter at this time.  Apparently patients daughter had been trying to get in touch with him for 2 days and then finally when she went over she found him on the ground therefore brought him to the Ed. Patient has had poor appetite recently and been suffering from progressive worsening of his dementia. Apparently he has a hernia repair surgery about a week ago.  Assessment & Plan   Sepsis secondary to E. Coli Bacteremia -unclear etiology -vancomycin has been stopped and pt currently on Zosyn, continue Zosyn day #3 -plan to narrow down when sensitivity report is back - transition to ampicillin and discharge with amoxicillin 500mg  TID through 3/15 -ID consulted, Dr. Luciana Axe recommends about 10 days of oral antibiotics (as above) -Repeat blood cultures from 12/18/2016 pending -WBC is trending downward, afebrile  Hypotension secondary to sepsis  -keep on IVF -monitor vitals in SDU for now  Paroxsymal A-fib with RVR, mild trop elevation  -has been started on Cardizem drip but titration limited due to hypotension. Placed on amiodarone drip and disontinued. -HR this AM has been liable, however, currently in sinus rhythm  -Cardiology consulted and appreciated -Elevated troponin likely from sepsis and Afib -Echocardiogram EF 65-70%, grade 1 diastolic dysfunction -Discussed Anticoagulation with Dr. Excell Seltzer, cardio, recommended 1 month of low dose AC- given his risk of stroke is highest in the next month and patient will likely go to a SNF -[facility]  3 -Transitioned to oral amiodarone  Unwitnessed fall, pt with known dementia  -pt does not not recall the event -wants to go home -it appears that he is baseline mental status -Psychiatry consulted and appreciated, patient is not able to make his own decisions -Discussed this with the daughter. -PT recommended SNF -Social work consulted for placement  Mild rhabdomyolysis, transaminitis  -rhabdo is likely from fall -Improving, CK 42 -LFTs trending downward  AKI -no baseline Cr available. Avoid nephrotoxic drugs  -appears to be pre renal -Creatinine upon admisison was 2.64, currently 1.52 -Continue to monitor BMP  Thrombocytopenia -unclear etiology -no signs of bleeding -Platelets improving, currently 96  -Continue to monitor CBC  Hypokalemia -K was 6.2 this morning, repeat was 3.2.  -Will replace and continue to monitor BMP  BPH -Continue flomax   Anemia  -Baseline unknown, currently 10.5 and stable -Continue to monitor CBC  GERD -Continue PPI  Goals of care -Palliative care consulted and appreciated  Malnutrition -Nutrition consulted  DVT Prophylaxis  Eliquis  Code Status: DNR  Family Communication: None at bedside, daughter via phone  Disposition Plan: Admitted, SNF at d/c- likely within the next 24 hours. Pending improvement in electrolytes  Consultants Cardiology Infectious disease Psychiatry   Procedures  Echocardiogram  Antibiotics   Anti-infectives    Start     Dose/Rate Route Frequency Ordered Stop   12/18/16 1115  ampicillin (OMNIPEN) 2 g in sodium chloride 0.9 % 50 mL IVPB     2 g 150 mL/hr over 20 Minutes Intravenous Every 12 hours 12/18/16 1100     12/17/16 2000  vancomycin (  VANCOCIN) IVPB 750 mg/150 ml premix  Status:  Discontinued     750 mg 150 mL/hr over 60 Minutes Intravenous Every 48 hours 12/15/16 2034 12/16/16 1713   12/17/16 1300  piperacillin-tazobactam (ZOSYN) IVPB 3.375 g  Status:  Discontinued     3.375 g 12.5  mL/hr over 240 Minutes Intravenous Every 8 hours 12/17/16 0855 12/18/16 1100   12/16/16 0400  piperacillin-tazobactam (ZOSYN) IVPB 2.25 g  Status:  Discontinued     2.25 g 100 mL/hr over 30 Minutes Intravenous Every 8 hours 12/15/16 2034 12/17/16 0855   12/15/16 1930  piperacillin-tazobactam (ZOSYN) IVPB 3.375 g     3.375 g 100 mL/hr over 30 Minutes Intravenous  Once 12/15/16 1924 12/15/16 2010   12/15/16 1930  vancomycin (VANCOCIN) IVPB 1000 mg/200 mL premix     1,000 mg 200 mL/hr over 60 Minutes Intravenous  Once 12/15/16 1924 12/15/16 2253      Subjective:   Travis Bradley seen and examined today.  Complains of right hip pain today. Denies chest pain, shortness of breath, abdominal pain.    Objective:   Vitals:   12/19/16 0600 12/19/16 0700 12/19/16 0800 12/19/16 1224  BP: 94/60 116/62 135/78   Pulse: 89 84 89   Resp: 20     Temp:   98 F (36.7 C) 98 F (36.7 C)  TempSrc:   Oral Axillary  SpO2: 92% 92% 96%   Weight:      Height:        Intake/Output Summary (Last 24 hours) at 12/19/16 1307 Last data filed at 12/19/16 1224  Gross per 24 hour  Intake           282.02 ml  Output              625 ml  Net          -342.98 ml   Filed Weights   12/17/16 0355 12/18/16 0403 12/19/16 0448  Weight: 58.4 kg (128 lb 12 oz) 63.1 kg (139 lb 1.8 oz) 61.6 kg (135 lb 12.9 oz)    Exam  General: Well developed, thin, elderly male, NAD  HEENT: NCAT, mucous membranes moist.   Cardiovascular: S1 S2 auscultated, RRR, ?soft SEM  Respiratory: Clear to auscultation bilaterally with equal chest rise  Abdomen: Soft, nontender, nondistended, + bowel sounds  Extremities: warm dry without cyanosis clubbing or edema  Neuro: AAOx2, nonfocal, very hard of hearing   Psych: Appropriate mood and affect, however lacks insight into medical conditions   Data Reviewed: I have personally reviewed following labs and imaging studies  CBC:  Recent Labs Lab 12/15/16 1712 12/16/16 0202  12/17/16 0223 12/18/16 0557 12/19/16 0256  WBC 8.7 21.3* 16.3* 15.3* 13.8*  HGB 11.8* 10.2* 11.2* 10.5* 10.5*  HCT 34.8* 30.8* 34.0* 32.0* 31.9*  MCV 92.1 91.7 91.9 92.0 91.4  PLT 64* 62* 73* 96* 123*   Basic Metabolic Panel:  Recent Labs Lab 12/15/16 1712 12/16/16 0202 12/17/16 0223 12/18/16 0557 12/19/16 0256 12/19/16 0825  NA 141 142 144 143 139  --   K 4.1 3.7 3.8 3.3* 6.2* 3.2*  CL 108 114* 117* 115* 115*  --   CO2 19* 19* 19* 20* 18*  --   GLUCOSE 98 114* 123* 108* 121*  --   BUN 86* 71* 49* 40* 31*  --   CREATININE 2.64* 2.21* 1.74* 1.69* 1.52*  --   CALCIUM 8.6* 7.6* 8.0* 8.1* 7.8*  --   MG  --  2.0  --   --   --   --  PHOS  --  3.6  --   --   --   --    GFR: Estimated Creatinine Clearance: 25.8 mL/min (by C-G formula based on SCr of 1.52 mg/dL (H)). Liver Function Tests:  Recent Labs Lab 12/16/16 0202 12/19/16 0256  AST 58* 69*  ALT 89* 52  ALKPHOS 136* 113  BILITOT 1.7* 3.0*  PROT 5.5* 5.8*  ALBUMIN 2.1* 1.9*   No results for input(s): LIPASE, AMYLASE in the last 168 hours. No results for input(s): AMMONIA in the last 168 hours. Coagulation Profile: No results for input(s): INR, PROTIME in the last 168 hours. Cardiac Enzymes:  Recent Labs Lab 12/15/16 1710 12/16/16 0202 12/16/16 0733 12/16/16 1346 12/18/16 0557  CKTOTAL 539* 363  --   --  42*  TROPONINI  --  0.14* 0.09* 0.08*  --    BNP (last 3 results) No results for input(s): PROBNP in the last 8760 hours. HbA1C: No results for input(s): HGBA1C in the last 72 hours. CBG:  Recent Labs Lab 12/18/16 1205 12/18/16 1612 12/18/16 2140 12/19/16 0755 12/19/16 1222  GLUCAP 130* 167* 111* 108* 130*   Lipid Profile: No results for input(s): CHOL, HDL, LDLCALC, TRIG, CHOLHDL, LDLDIRECT in the last 72 hours. Thyroid Function Tests: No results for input(s): TSH, T4TOTAL, FREET4, T3FREE, THYROIDAB in the last 72 hours. Anemia Panel: No results for input(s): VITAMINB12, FOLATE, FERRITIN,  TIBC, IRON, RETICCTPCT in the last 72 hours. Urine analysis:    Component Value Date/Time   COLORURINE AMBER (A) 12/15/2016 1814   APPEARANCEUR CLOUDY (A) 12/15/2016 1814   LABSPEC 1.019 12/15/2016 1814   PHURINE 5.0 12/15/2016 1814   GLUCOSEU NEGATIVE 12/15/2016 1814   HGBUR LARGE (A) 12/15/2016 1814   BILIRUBINUR SMALL (A) 12/15/2016 1814   KETONESUR NEGATIVE 12/15/2016 1814   PROTEINUR 100 (A) 12/15/2016 1814   NITRITE NEGATIVE 12/15/2016 1814   LEUKOCYTESUR NEGATIVE 12/15/2016 1814   Sepsis Labs: @LABRCNTIP (procalcitonin:4,lacticidven:4)  ) Recent Results (from the past 240 hour(s))  Blood Culture (routine x 2)     Status: Abnormal   Collection Time: 12/15/16  6:05 PM  Result Value Ref Range Status   Specimen Description BLOOD RIGHT ARM  Final   Special Requests   Final    BOTTLES DRAWN AEROBIC AND ANAEROBIC 10CC BOTH BOTTLES   Culture  Setup Time   Final    GRAM NEGATIVE RODS IN BOTH AEROBIC AND ANAEROBIC BOTTLES Organism ID to follow CRITICAL RESULT CALLED TO, READ BACK BY AND VERIFIED WITH: Artelia Laroche, PHARM, 12/16/16 AT 1205 BY J FUDESCO Performed at [facility], [address], [place], [place] [address]    Culture ESCHERICHIA COLI (A)  Final   Report Status 12/18/2016 FINAL  Final   Organism ID, Bacteria ESCHERICHIA COLI  Final      Susceptibility   Escherichia coli - MIC*    AMPICILLIN 4 SENSITIVE Sensitive     CEFAZOLIN <=4 SENSITIVE Sensitive     CEFEPIME <=1 SENSITIVE Sensitive     CEFTAZIDIME <=1 SENSITIVE Sensitive     CEFTRIAXONE <=1 SENSITIVE Sensitive     CIPROFLOXACIN <=0.25 SENSITIVE Sensitive     GENTAMICIN <=1 SENSITIVE Sensitive     IMIPENEM <=0.25 SENSITIVE Sensitive     TRIMETH/SULFA <=20 SENSITIVE Sensitive     AMPICILLIN/SULBACTAM <=2 SENSITIVE Sensitive     PIP/TAZO <=4 SENSITIVE Sensitive     Extended ESBL NEGATIVE Sensitive     * ESCHERICHIA COLI  Blood Culture (routine x 2)  Status: Abnormal   Collection Time: 12/15/16  6:05  PM  Result Value Ref Range Status   Specimen Description BLOOD LEFT ARM  Final   Special Requests   Final    BOTTLES DRAWN AEROBIC AND ANAEROBIC 5CC BOTH BOTTLES   Culture  Setup Time   Final    GRAM NEGATIVE RODS ANAEROBIC BOTTLE ONLY CRITICAL RESULT CALLED TO, READ BACK BY AND VERIFIED WITH: M. TURNER, PHARM, 12/16/16 AT 1205 BY J FUDESCO    Culture (A)  Final    ESCHERICHIA COLI SUSCEPTIBILITIES PERFORMED ON PREVIOUS CULTURE WITHIN THE LAST 5 DAYS. Performed at [facility], [address] [place], [place] [address]    Report Status 12/18/2016 FINAL  Final  Blood Culture ID Panel (Reflexed)     Status: Abnormal   Collection Time: 12/15/16  6:05 PM  Result Value Ref Range Status   Enterococcus species NOT DETECTED NOT DETECTED Final   Listeria monocytogenes NOT DETECTED NOT DETECTED Final   Staphylococcus species NOT DETECTED NOT DETECTED Final   Staphylococcus aureus NOT DETECTED NOT DETECTED Final   Streptococcus species NOT DETECTED NOT DETECTED Final   Streptococcus agalactiae NOT DETECTED NOT DETECTED Final   Streptococcus pneumoniae NOT DETECTED NOT DETECTED Final   Streptococcus pyogenes NOT DETECTED NOT DETECTED Final   Acinetobacter baumannii NOT DETECTED NOT DETECTED Final   Enterobacteriaceae species DETECTED (A) NOT DETECTED Final    Comment: Enterobacteriaceae represent a large family of gram-negative bacteria, not a single organism. CRITICAL RESULT CALLED TO, READ BACK BY AND VERIFIED WITH: M. TURNER, PHARM, 12/16/16 AT 1205 BY J FUDESCO    Enterobacter cloacae complex NOT DETECTED NOT DETECTED Final   Escherichia coli DETECTED (A) NOT DETECTED Final    Comment: CRITICAL RESULT CALLED TO, READ BACK BY AND VERIFIED WITH: M. TURNER, PHARM, 12/16/16 AT 1205 BY J FUDESCO    Klebsiella oxytoca NOT DETECTED NOT DETECTED Final   Klebsiella pneumoniae NOT DETECTED NOT DETECTED Final   Proteus species NOT DETECTED NOT DETECTED Final   Serratia marcescens NOT DETECTED  NOT DETECTED Final   Carbapenem resistance NOT DETECTED NOT DETECTED Final   Haemophilus influenzae NOT DETECTED NOT DETECTED Final   Neisseria meningitidis NOT DETECTED NOT DETECTED Final   Pseudomonas aeruginosa NOT DETECTED NOT DETECTED Final   Candida albicans NOT DETECTED NOT DETECTED Final   Candida glabrata NOT DETECTED NOT DETECTED Final   Candida krusei NOT DETECTED NOT DETECTED Final   Candida parapsilosis NOT DETECTED NOT DETECTED Final   Candida tropicalis NOT DETECTED NOT DETECTED Final    Comment: Performed at [facility], [address] [place], [place] [address]  Urine culture     Status: Abnormal   Collection Time: 12/15/16  6:14 PM  Result Value Ref Range Status   Specimen Description URINE, CLEAN CATCH  Final   Special Requests NONE  Final   Culture MULTIPLE SPECIES PRESENT, SUGGEST RECOLLECTION (A)  Final   Report Status 12/17/2016 FINAL  Final  MRSA PCR Screening     Status: None   Collection Time: 12/16/16 12:03 AM  Result Value Ref Range Status   MRSA by PCR NEGATIVE NEGATIVE Final    Comment:        The GeneXpert MRSA Assay (FDA approved for NASAL specimens only), is one component of a comprehensive MRSA colonization surveillance program. It is not intended to diagnose MRSA infection nor to guide or monitor treatment for MRSA infections.       Radiology Studies:  No results found.   Scheduled Meds: . amiodarone  400 mg Oral BID  . ampicillin (OMNIPEN) IV  2 g Intravenous Q12H  . apixaban  2.5 mg Oral BID  . aspirin  325 mg Oral Daily  . docusate sodium  100 mg Oral BID  . feeding supplement (ENSURE ENLIVE)  237 mL Oral BID BM  . guaiFENesin  600 mg Oral BID  . multivitamin with minerals  1 tablet Oral Daily  . omega-3 acid ethyl esters  2 g Oral Daily  . pantoprazole  40 mg Oral Daily  . potassium chloride  40 mEq Oral Once  . potassium chloride  40 mEq Oral Once  . RESOURCE THICKENUP CLEAR   Oral Once   Continuous Infusions:    LOS: 4 days   Time Spent in minutes   30 minutes  Ninoshka Wainwright D.O. on 12/19/2016 at 1:07 PM  Between 7am to 7pm - Pager - [phone]  After 7pm go to [email or web address] - password [email or web address]  And look for the night coverage person covering for me after hours  [facility] Office  [phone]

## 2016-12-19 NOTE — Progress Notes (Signed)
Modified Barium Swallow Progress Note  Patient Details  Name: Travis Bradley MRN: [id number] Date of Birth: 08/14/1920  Today's Date: 12/19/2016  Modified Barium Swallow completed.  Full report located under Chart Review in the Imaging Section.  Brief recommendations include the following:  Clinical Impression  Patient with a moderate oropharyngeal dysphagia characterized by generalized weakness of swallowing muscualture including base of tongue,larynx, and pharynx, resulting in decreased laryngeal closure with penetration during the swallow and after the swallow (thin greater than nectar thick liquid) due to moderate-severe pharyngeal residuals. Penetration trace however and intermittent in occurrance, clearing with combination of spontaneous and cued throat clear. Although penetration occurring more with thin vs nectar thick liquid, residuals greater with thickened liquids increasing risk of post swallow penetration/aspiration. Additionally, risk of aspiration PNA greater with aspiration of nectar thick vs thin liquids. Recommend dysphagia 3, thin liquids, throat clearing every 2 sips. Will f/u for tolerance and education.    Swallow Evaluation Recommendations       SLP Diet Recommendations: Dysphagia 3 (Mech soft) solids;Thin liquid   Liquid Administration via: Cup   Medication Administration: Whole meds with puree   Supervision: Patient able to self feed;Full supervision/cueing for compensatory strategies   Compensations: Slow rate;Small sips/bites;Clear throat intermittently (clear throat very 2 sips)   Postural Changes: Seated upright at 90 degrees   Oral Care Recommendations: Oral care BID      Travis LangoLeah Marinell Igarashi MA, CCC-SLP [phone]  Travis Bradley 12/19/2016,1:35 PM

## 2016-12-19 NOTE — Plan of Care (Signed)
Problem: Physical Regulation: Goal: Ability to maintain clinical measurements within normal limits will improve Outcome: Not Progressing Patient HR shot up into Afib RVR 160's-170's again. Patient was transitioned off of amiodarone drip previous shift and switched to PO. Patient had to be restarted on drip. HR now controlled in 80's.   Problem: Activity: Goal: Risk for activity intolerance will decrease Outcome: Progressing Patient eager to get out of bed and sit up. He is still weak, but PT worked with him previously.

## 2016-12-20 DIAGNOSIS — Z515 Encounter for palliative care: Secondary | ICD-10-CM

## 2016-12-20 LAB — BASIC METABOLIC PANEL
Anion gap: 7 (ref 5–15)
BUN: 23 mg/dL — AB (ref 6–20)
CO2: 20 mmol/L — ABNORMAL LOW (ref 22–32)
CREATININE: 1.32 mg/dL — AB (ref 0.61–1.24)
Calcium: 8 mg/dL — ABNORMAL LOW (ref 8.9–10.3)
Chloride: 116 mmol/L — ABNORMAL HIGH (ref 101–111)
GFR, EST AFRICAN AMERICAN: 52 mL/min — AB (ref >60)
GFR, EST NON AFRICAN AMERICAN: 44 mL/min — AB (ref >60)
Glucose, Bld: 119 mg/dL — ABNORMAL HIGH (ref 65–99)
Potassium: 3.4 mmol/L — ABNORMAL LOW (ref 3.5–5.1)
SODIUM: 143 mmol/L (ref 135–145)

## 2016-12-20 LAB — CBC
HCT: 28.1 % — ABNORMAL LOW (ref 39.0–52.0)
Hemoglobin: 9.5 g/dL — ABNORMAL LOW (ref 13.0–17.0)
MCH: 30.4 pg (ref 26.0–34.0)
MCHC: 33.8 g/dL (ref 30.0–36.0)
MCV: 90.1 fL (ref 78.0–100.0)
PLATELETS: 109 10*3/uL — AB (ref 150–400)
RBC: 3.12 MIL/uL — AB (ref 4.22–5.81)
RDW: 15.1 % (ref 11.5–15.5)
WBC: 10.8 10*3/uL — ABNORMAL HIGH (ref 4.0–10.5)

## 2016-12-20 LAB — GLUCOSE, CAPILLARY
GLUCOSE-CAPILLARY: 111 mg/dL — AB (ref 65–99)
GLUCOSE-CAPILLARY: 119 mg/dL — AB (ref 65–99)
Glucose-Capillary: 98 mg/dL (ref 65–99)

## 2016-12-20 MED ORDER — POTASSIUM CHLORIDE CRYS ER 20 MEQ PO TBCR
40.0000 meq | EXTENDED_RELEASE_TABLET | Freq: Once | ORAL | Status: AC
  Administered 2016-12-20: 40 meq via ORAL
  Filled 2016-12-20: qty 2

## 2016-12-20 MED ORDER — ENSURE ENLIVE PO LIQD: 237.0000 mL | Freq: Two times a day (BID) | ORAL | 12 refills | Status: DC

## 2016-12-20 MED ORDER — AMOXICILLIN 500 MG PO CAPS: 500.0000 mg | ORAL_CAPSULE | Freq: Three times a day (TID) | ORAL | 0 refills | Status: AC

## 2016-12-20 MED ORDER — APIXABAN 2.5 MG PO TABS: 2.5000 mg | ORAL_TABLET | Freq: Two times a day (BID) | ORAL | 0 refills | Status: AC

## 2016-12-20 MED ORDER — RESOURCE THICKENUP CLEAR PO POWD: ORAL | Status: DC

## 2016-12-20 MED ORDER — AMIODARONE HCL 200 MG PO TABS: ORAL_TABLET | ORAL | 0 refills | Status: DC

## 2016-12-20 MED ORDER — HYDROCODONE-ACETAMINOPHEN 5-325 MG PO TABS: 1.0000 | ORAL_TABLET | Freq: Four times a day (QID) | ORAL | 0 refills | Status: AC | PRN

## 2016-12-20 NOTE — Progress Notes (Signed)
Palliative:  Mr. Travis Bradley is sleeping. No family at bedside. Progressing. Goal is to transition to po amiodarone and go to SNF rehab. Please call for further palliative needs.   No charge  Yong ChannelAlicia Brittaney Beaulieu, NP Palliative Medicine Team Pager # [phone] (M-F 8a-5p) Team Phone # [phone] (Nights/Weekends)

## 2016-12-20 NOTE — Progress Notes (Signed)
Physical Therapy Treatment Patient Details Name: Travis BartersWade O Turbyfill MRN: [id number] DOB: 11/23/1919 Today's Date: 12/20/2016    History of Present Illness [age] y.o.maleadmitted following an unwitnessed fall at home. X-rays negative for any orthopedic injury but noted to have atrial fibrillation, hypotension, and dehydration. PMH: dementia, COPD, recent hernia repair (approx. 1 week prior to admission).     PT Comments    Pt making progress with mobility, increasing distance ambulated (20' x2 with min A and RW). All VSS throughout with pt on RA. Pt would continue to benefit from skilled physical therapy services at this time while admitted and after d/c to address his limitations in order to improve his overall safety and independence with functional mobility.     Follow Up Recommendations  SNF;Supervision for mobility/OOB     Equipment Recommendations  Rolling walker with 5" wheels    Recommendations for Other Services       Precautions / Restrictions Precautions Precautions: Fall Restrictions Weight Bearing Restrictions: No    Mobility  Bed Mobility               General bed mobility comments: pt sitting OOB in recliner when PT entered room  Transfers Overall transfer level: Needs assistance Equipment used: Rolling walker (2 wheeled) Transfers: Sit to/from Stand Sit to Stand: Mod assist;+2 physical assistance         General transfer comment: pt required multiple verbal and tactile cues for sequencing and bilateral hand placement. Mod A x2 to power up into standing from recliner and from straight chair.  Ambulation/Gait Ambulation/Gait assistance: Min assist;+2 safety/equipment Ambulation Distance (Feet): 20 Feet (20' x2 with sitting rest break) Assistive device: Rolling walker (2 wheeled) Gait Pattern/deviations: Shuffle;Trunk flexed;Decreased stride length Gait velocity: decreased   General Gait Details: pt required constant min A and verbal/tactile cues for  safety with RW    Stairs            Wheelchair Mobility    Modified Rankin (Stroke Patients Only)       Balance Overall balance assessment: Needs assistance Sitting-balance support: Feet supported Sitting balance-Leahy Scale: Fair     Standing balance support: Bilateral upper extremity supported Standing balance-Leahy Scale: Poor Standing balance comment: pt reliant on bilateral UEs on RW                    Cognition Arousal/Alertness: Awake/alert Behavior During Therapy: WFL for tasks assessed/performed Overall Cognitive Status: Impaired/Different from baseline Area of Impairment: Attention;Memory;Following commands;Safety/judgement;Problem solving;Awareness     Memory: Decreased short-term memory Following Commands: Follows one step commands with increased time Safety/Judgement: Decreased awareness of safety;Decreased awareness of deficits   Problem Solving: Slow processing;Decreased initiation;Difficulty sequencing;Requires verbal cues;Requires tactile cues      Exercises General Exercises - Lower Extremity Long Arc Quad: AROM;Strengthening;Both;10 reps;Seated    General Comments        Pertinent Vitals/Pain Pain Assessment: No/denies pain    Home Living                      Prior Function            PT Goals (current goals can now be found in the care plan section) Acute Rehab PT Goals Patient Stated Goal: return home PT Goal Formulation: With patient Time For Goal Achievement: 12/30/16 Potential to Achieve Goals: Fair Progress towards PT goals: Progressing toward goals    Frequency    Min 3X/week      PT Plan Current plan  remains appropriate;Frequency needs to be updated    Co-evaluation PT/OT/SLP Co-Evaluation/Treatment: Yes Reason for Co-Treatment: For patient/therapist safety;To address functional/ADL transfers PT goals addressed during session: Mobility/safety with mobility;Balance;Proper use of  DME;Strengthening/ROM       End of Session Equipment Utilized During Treatment: Gait belt Activity Tolerance: Patient limited by fatigue Patient left: in chair;with call bell/phone within reach;with chair alarm set Nurse Communication: Mobility status PT Visit Diagnosis: Unsteadiness on feet (R26.81);Muscle weakness (generalized) (M62.81);History of falling (Z91.81)     Time: 1610-9604 PT Time Calculation (min) (ACUTE ONLY): 38 min  Charges:  $Gait Training: 8-22 mins                    G Codes:       Alessandra Bevels V[place] Basista 01-03-2017, 12:50 PM Deborah Chalk, PT, DPT [phone]

## 2016-12-20 NOTE — Discharge Summary (Addendum)
Physician Discharge Summary  Travis Bradley:[id number] DOB: [birth year] DOA: 12/15/2016  PCP: Romie Jumper, PA-C  Admit date: 12/15/2016 Discharge date: 12/20/2016  Time spent: 45 minutes  Recommendations for Outpatient Follow-up:  Patient will be discharged to skilled nursing facility. Continue physical and occupational therapy.  Patient will need to follow up with primary care provider within one week of discharge, repeat CBC, CMP in one week.  Follow up with Afib clinic in 3-4 weeks. Patient should continue medications as prescribed.  Patient should follow a dysphagia 3 diet. Continue supplemental oxygen with exertion and wean as possible.    Discharge Diagnoses:  Sepsis secondary to E. Coli Bacteremia Hypotension secondary to sepsis  Paroxsymal A-fib with RVR, mild trop elevation  Unwitnessed fall, pt with known dementia  Mild rhabdomyolysis, transaminitis  AKI Hypokalemia BPH Anemia, normocytic GERD Goals of care Malnutrition  Discharge Condition: Stable  Diet recommendation: dysphagia 3 diet  Filed Weights   12/18/16 0403 12/19/16 0448 12/20/16 0354  Weight: 63.1 kg (139 lb 1.8 oz) 61.6 kg (135 lb 12.9 oz) 62.1 kg (136 lb 14.5 oz)    History of present illness:  On 12/16/2016 by Dr. Stephania Fragmin Travis Bradley is a 83 y.o. male with medical history significant of with bph, possible copd was brought to the ed after unwitnessed fall. History is mostly per the records as patient has baseline dementia therefore unable to participate much in meaningfull hx and unable to get in touch with his daughter at this time.  Apparently patients daughter had been trying to get in touch with him for 2 days and then finally when she went over she found him on the ground therefore brought him to the Ed. Patient has had poor appetite recently and been suffering from progressive worsening of his dementia. Apparently he has a hernia repair surgery about a week ago.   Hospital Course:  Sepsis  secondary to E. Coli Bacteremia -Exact etiology unclear- thought to be urine or GI (patient did have inguinal hernia repair 2 months ago) -vancomycin has been stopped and pt currently on Zosyn -plan to narrow down when sensitivity report is back - transitioned to ampicillin and discharge with amoxicillin 500mg  TID through 3/15 -ID consulted, Dr. Luciana Axe recommends about 10 days of oral antibiotics (as above) -Repeat blood cultures from 12/18/2016 show no growth to date -WBC is trending downward, afebrile  Hypotension secondary to sepsis  -keep on IVF -monitor vitals in SDU for now  Paroxsymal A-fib with RVR, mild trop elevation  -has been started on Cardizem drip but titration limited due to hypotension. Placed on amiodarone drip and disontinued. -HR this AM has been liable, however, currently in sinus rhythm  -Cardiology consulted and appreciated -Elevated troponin likely from sepsis and Afib -Echocardiogram EF 65-70%, grade 1 diastolic dysfunction -Discussed Anticoagulation with Dr. Excell Seltzer, cardio, recommended 1 month of low dose AC- given his risk of stroke is highest in the next month and patient will likely go to a SNF -[facility] 3 -Transitioned to oral amiodarone -Continue Eliquis 2.5mg  BID for one month, Amiodarone 400mg  BID x7 days, then 200mg  daily.  -Will need follow up in Afib clinic in 3-4 weeks -Given that patient will be on Eliquis, would hold aspirin until completion of Eliquis in one month  Unwitnessed fall, pt with known dementia  -pt does not not recall the event -wants to go home -it appears that he is baseline mental status -Psychiatry consulted and appreciated, patient is not able to make his  own decisions -Discussed this with the daughter. -PT recommended SNF -Social work consulted for placement  Mild rhabdomyolysis, transaminitis  -rhabdo is likely from fall -Improving, CK 42 -LFTs trending downward -Repeat in one week  AKI -no baseline Cr available.  Avoid nephrotoxic drugs  -appears to be pre renal -Creatinine upon admisison was 2.64, currently 1.32 -Repeat BMP in one week  Thrombocytopenia -unclear etiology -no signs of bleeding -Platelets improving, currently 109 -Repeat CBC in one week  Hypokalemia -K 3.4, has received replacement. -Repeat BMP in one week  BPH -Continue flomax   Anemia, normocytic -Baseline unknown, currently 9.5  -Repeat CBC in one week  GERD -Continue PPI  Goals of care -Palliative care consulted and appreciated  Malnutrition -Nutrition consulted, recommended supplements  Code Status: DNR  Consultants Cardiology Infectious disease Psychiatry   Procedures  Echocardiogram  Discharge Exam: Vitals:   12/20/16 0400 12/20/16 0835  BP: (!) 127/102 137/67  Pulse: 95   Resp: 18   Temp:  98.7 F (37.1 C)   Continues to complain of back and right hip pain today. Denies chest pain, shortness of breath, abdominal pain.    Exam  General: Well developed, thin, elderly male, NAD  HEENT: NCAT, mucous membranes moist.   Cardiovascular: S1 S2 auscultated, RRR  Respiratory: Clear to auscultation bilaterally  Abdomen: Soft, nontender, nondistended, + bowel sounds  Extremities: warm dry without cyanosis clubbing or edema  Neuro: AAOx2, nonfocal, very hard of hearing   Psych: Appropriate mood and affect  Discharge Instructions Discharge Instructions    Discharge instructions    Complete by:  As directed    Patient will be discharged to skilled nursing facility. Continue physical and occupational therapy.  Patient will need to follow up with primary care provider within one week of discharge, repeat CBC, CMP in one week.  Follow up with Afib clinic in 3-4 weeks.  Patient should continue medications as prescribed.  Patient should follow a dysphagia 3 diet.     Current Discharge Medication List    START taking these medications   Details  amiodarone (PACERONE) 200 MG tablet  Take 2 pills twice daily for 6 days (through 3/14). Starting 3/15, take one tablet daily. Qty: 48 tablet, Refills: 0    apixaban (ELIQUIS) 2.5 MG TABS tablet Take 1 tablet (2.5 mg total) by mouth 2 (two) times daily. Qty: 60 tablet, Refills: 0    feeding supplement, ENSURE ENLIVE, (ENSURE ENLIVE) LIQD Take 237 mLs by mouth 2 (two) times daily between meals. Qty: 237 mL, Refills: 12    Maltodextrin-Xanthan Gum (RESOURCE THICKENUP CLEAR) POWD Use as needed to thicken up liquids      CONTINUE these medications which have CHANGED   Details  HYDROcodone-acetaminophen (NORCO/VICODIN) 5-325 MG tablet Take 1 tablet by mouth every 6 (six) hours as needed for moderate pain. Qty: 20 tablet, Refills: 0      CONTINUE these medications which have NOT CHANGED   Details  albuterol (PROAIR HFA) 108 (90 BASE) MCG/ACT inhaler Inhale 2 puffs into the lungs every 6 (six) hours as needed for wheezing.    docusate sodium (COLACE) 100 MG capsule Take 100 mg by mouth 2 (two) times daily.    nitroGLYCERIN (NITROSTAT) 0.4 MG SL tablet Place 0.4 mg under the tongue every 5 (five) minutes as needed for chest pain.    pseudoephedrine-guaifenesin (MUCINEX D) 60-600 MG per tablet Take 1 tablet by mouth every 12 (twelve) hours.    tamsulosin (FLOMAX) 0.4 MG CAPS capsule Take 0.4  mg by mouth.    fish oil-omega-3 fatty acids 1000 MG capsule Take 2 g by mouth daily.    multivitamin-iron-minerals-folic acid (CENTRUM) chewable tablet Chew 1 tablet by mouth daily.    omeprazole (PRILOSEC) 10 MG capsule Take 10 mg by mouth daily.      STOP taking these medications     aspirin 325 MG tablet        No Known Allergies  Contact information for follow-up providers    Barden, Lucy P, PA-C. Schedule an appointment as soon as possible for a visit in 1 week(s).   Specialty:  Physician Assistant Why:  Hospital follow up Contact information: [address] [place] [address] [phone]        Tonny Bollman, MD. Schedule an appointment as soon as possible for a visit in 3 week(s).   Specialty:  Cardiology Why:  Atrial fib clinic Contact information: [address] [place] [address] [phone]            Contact information for after-discharge care    Destination    [facility] .   Specialty:  Skilled Nursing Facility Contact information: [address] [place] [address] [phone]                   The results of significant diagnostics from this hospitalization (including imaging, microbiology, ancillary and laboratory) are listed below for reference.    Significant Diagnostic Studies: Dg Chest Port 1 View  Result Date: 12/15/2016 CLINICAL DATA:  Possible fall, left hip pain. EXAM: PORTABLE CHEST 1 VIEW COMPARISON:  None. FINDINGS: Heart size and mediastinal contours are within normal limits. Atherosclerotic changes noted at the aortic arch. Coarse interstitial markings are seen throughout both lungs, of uncertain chronicity, interstitial edema versus chronic interstitial lung disease. Suspect chronic bronchitic changes centrally. No confluent airspace opacity. No pleural effusion or pneumothorax seen. No osseous fracture or dislocation seen. IMPRESSION: 1. Coarse interstitial lung markings throughout both lungs, of uncertain chronicity, interstitial edema versus chronic interstitial lung disease. 2. Suspect chronic bronchitic changes. 3. No evidence of consolidating pneumonia. 4. Aortic atherosclerosis. Electronically Signed   By: Bary Richard M.D.   On: 12/15/2016 20:25   Dg Swallowing Func-speech Pathology  Result Date: 12/19/2016 Objective Swallowing Evaluation: Type of Study: MBS-Modified Barium Swallow Study Patient Details Name: RYU CERRETA MRN: [id number] Date of Birth: Dec 07, 1919 Today's Date: 12/19/2016 Time: SLP Start Time (ACUTE ONLY): 1035-SLP Stop Time (ACUTE ONLY): 1103 SLP Time  Calculation (min) (ACUTE ONLY): 28 min Past Medical History: Past Medical History: Diagnosis Date . COPD (chronic obstructive pulmonary disease) (HCC)  . Stomach ulcer  . Stomach ulcer  Past Surgical History: Past Surgical History: Procedure Laterality Date . HERNIA REPAIR   HPI: 83 y.o.malewith medical history significant of with bph, possible copd was brought to the ed after unwitnessed fall. Dx includes sepsis, hypotension, afib, AKI, mild rhabdo.   Subjective: alert, communicative Assessment / Plan / Recommendation CHL IP CLINICAL IMPRESSIONS 12/19/2016 Clinical Impression Patient with a moderate oropharyngeal dysphagia characterized by generalized weakness of swallowing muscualture including base of tongue,larynx, and pharynx, resulting in decreased laryngeal closure with penetration during the swallow and after the swallow (thin greater than nectar thick liquid) due to moderate-severe pharyngeal residuals. Penetration trace however and intermittent in occurrance, clearing with combination of spontaneous and cued throat clear. Although penetration occurring more with thin vs nectar thick liquid, residuals greater with thickened liquids increasing  risk of post swallow penetration/aspiration. Additionally, risk of aspiration PNA greater with aspiration of nectar thick vs thin liquids. Recommend dysphagia 3, thin liquids, throat clearing every 2 sips. Will f/u for tolerance and education.  SLP Visit Diagnosis Dysphagia, oropharyngeal phase (R13.12) Attention and concentration deficit following -- Frontal lobe and executive function deficit following -- Impact on safety and function Moderate aspiration risk   CHL IP TREATMENT RECOMMENDATION 12/19/2016 Treatment Recommendations Therapy as outlined in treatment plan below   No flowsheet data found. CHL IP DIET RECOMMENDATION 12/19/2016 SLP Diet Recommendations Dysphagia 3 (Mech soft) solids;Thin liquid Liquid Administration via Cup Medication Administration Whole meds  with puree Compensations Slow rate;Small sips/bites;Clear throat intermittently Postural Changes Seated upright at 90 degrees   CHL IP OTHER RECOMMENDATIONS 12/19/2016 Recommended Consults -- Oral Care Recommendations Oral care BID Other Recommendations --   CHL IP FOLLOW UP RECOMMENDATIONS 12/19/2016 Follow up Recommendations Home health SLP   CHL IP FREQUENCY AND DURATION 12/19/2016 Speech Therapy Frequency (ACUTE ONLY) min 2x/week Treatment Duration 2 weeks      CHL IP ORAL PHASE 12/19/2016 Oral Phase WFL Oral - Pudding Teaspoon -- Oral - Pudding Cup -- Oral - Honey Teaspoon -- Oral - Honey Cup -- Oral - Nectar Teaspoon -- Oral - Nectar Cup -- Oral - Nectar Straw -- Oral - Thin Teaspoon -- Oral - Thin Cup -- Oral - Thin Straw -- Oral - Puree -- Oral - Mech Soft -- Oral - Regular -- Oral - Multi-Consistency -- Oral - Pill -- Oral Phase - Comment --  CHL IP PHARYNGEAL PHASE 12/19/2016 Pharyngeal Phase Impaired Pharyngeal- Pudding Teaspoon -- Pharyngeal -- Pharyngeal- Pudding Cup -- Pharyngeal -- Pharyngeal- Honey Teaspoon -- Pharyngeal -- Pharyngeal- Honey Cup -- Pharyngeal -- Pharyngeal- Nectar Teaspoon Delayed swallow initiation-pyriform sinuses;Reduced pharyngeal peristalsis;Reduced anterior laryngeal mobility;Reduced laryngeal elevation;Reduced airway/laryngeal closure;Reduced tongue base retraction;Pharyngeal residue - valleculae;Pharyngeal residue - pyriform;Lateral channel residue;Penetration/Aspiration before swallow Pharyngeal Material enters airway, CONTACTS cords and not ejected out Pharyngeal- Nectar Cup Reduced pharyngeal peristalsis;Reduced anterior laryngeal mobility;Reduced laryngeal elevation;Reduced airway/laryngeal closure;Reduced tongue base retraction;Pharyngeal residue - valleculae;Pharyngeal residue - pyriform;Lateral channel residue Pharyngeal Material does not enter airway Pharyngeal- Nectar Straw -- Pharyngeal -- Pharyngeal- Thin Teaspoon Reduced pharyngeal peristalsis;Reduced anterior laryngeal  mobility;Reduced laryngeal elevation;Reduced airway/laryngeal closure;Reduced tongue base retraction;Pharyngeal residue - valleculae;Pharyngeal residue - pyriform;Lateral channel residue;Penetration/Aspiration during swallow;Penetration/Apiration after swallow Pharyngeal Material enters airway, CONTACTS cords and not ejected out Pharyngeal- Thin Cup Reduced pharyngeal peristalsis;Reduced anterior laryngeal mobility;Reduced laryngeal elevation;Reduced airway/laryngeal closure;Reduced tongue base retraction;Pharyngeal residue - valleculae;Pharyngeal residue - pyriform;Lateral channel residue;Penetration/Aspiration during swallow;Penetration/Apiration after swallow Pharyngeal Material enters airway, CONTACTS cords and not ejected out Pharyngeal- Thin Straw -- Pharyngeal -- Pharyngeal- Puree Reduced pharyngeal peristalsis;Reduced anterior laryngeal mobility;Reduced laryngeal elevation;Reduced airway/laryngeal closure;Reduced tongue base retraction;Pharyngeal residue - valleculae;Pharyngeal residue - pyriform;Lateral channel residue Pharyngeal -- Pharyngeal- Mechanical Soft Reduced pharyngeal peristalsis;Reduced anterior laryngeal mobility;Reduced laryngeal elevation;Reduced airway/laryngeal closure;Reduced tongue base retraction;Pharyngeal residue - valleculae;Pharyngeal residue - pyriform;Lateral channel residue Pharyngeal -- Pharyngeal- Regular -- Pharyngeal -- Pharyngeal- Multi-consistency -- Pharyngeal -- Pharyngeal- Pill Reduced pharyngeal peristalsis;Reduced anterior laryngeal mobility;Reduced laryngeal elevation;Reduced airway/laryngeal closure;Reduced tongue base retraction;Pharyngeal residue - valleculae;Pharyngeal residue - pyriform;Lateral channel residue Pharyngeal -- Pharyngeal Comment --  Ferdinand Lango MA, CCC-SLP [phone] McCoy Leah Meryl 12/19/2016, 1:35 PM              Dg Hip Unilat W Or W/o Pelvis 2-3 Views Left  Result Date: 12/15/2016 CLINICAL DATA:  Left hip pain after possible fall. EXAM: DG HIP  (WITH OR  WITHOUT PELVIS) 2-3V LEFT COMPARISON:  None. FINDINGS: Femoral heads are located. Sacroiliac joints are symmetric. No acute fracture. Vascular calcifications. IMPRESSION: No acute osseous abnormality. Electronically Signed   By: Jeronimo GreavesKyle  Talbot M.D.   On: 12/15/2016 20:25    Microbiology: Recent Results (from the past 240 hour(s))  Blood Culture (routine x 2)     Status: Abnormal   Collection Time: 12/15/16  6:05 PM  Result Value Ref Range Status   Specimen Description BLOOD RIGHT ARM  Final   Special Requests   Final    BOTTLES DRAWN AEROBIC AND ANAEROBIC 10CC BOTH BOTTLES   Culture  Setup Time   Final    GRAM NEGATIVE RODS IN BOTH AEROBIC AND ANAEROBIC BOTTLES Organism ID to follow CRITICAL RESULT CALLED TO, READ BACK BY AND VERIFIED WITH: Artelia LarocheM. TURNER, PHARM, 12/16/16 AT 1205 BY J FUDESCO Performed at [facility], [address] [place], [place] [address]    Culture ESCHERICHIA COLI (A)  Final   Report Status 12/18/2016 FINAL  Final   Organism ID, Bacteria ESCHERICHIA COLI  Final      Susceptibility   Escherichia coli - MIC*    AMPICILLIN 4 SENSITIVE Sensitive     CEFAZOLIN <=4 SENSITIVE Sensitive     CEFEPIME <=1 SENSITIVE Sensitive     CEFTAZIDIME <=1 SENSITIVE Sensitive     CEFTRIAXONE <=1 SENSITIVE Sensitive     CIPROFLOXACIN <=0.25 SENSITIVE Sensitive     GENTAMICIN <=1 SENSITIVE Sensitive     IMIPENEM <=0.25 SENSITIVE Sensitive     TRIMETH/SULFA <=20 SENSITIVE Sensitive     AMPICILLIN/SULBACTAM <=2 SENSITIVE Sensitive     PIP/TAZO <=4 SENSITIVE Sensitive     Extended ESBL NEGATIVE Sensitive     * ESCHERICHIA COLI  Blood Culture (routine x 2)     Status: Abnormal   Collection Time: 12/15/16  6:05 PM  Result Value Ref Range Status   Specimen Description BLOOD LEFT ARM  Final   Special Requests   Final    BOTTLES DRAWN AEROBIC AND ANAEROBIC 5CC BOTH BOTTLES   Culture  Setup Time   Final    GRAM NEGATIVE RODS ANAEROBIC BOTTLE ONLY CRITICAL RESULT CALLED TO,  READ BACK BY AND VERIFIED WITH: M. TURNER, PHARM, 12/16/16 AT 1205 BY J FUDESCO    Culture (A)  Final    ESCHERICHIA COLI SUSCEPTIBILITIES PERFORMED ON PREVIOUS CULTURE WITHIN THE LAST 5 DAYS. Performed at [facility], [address] [place], [place] [address]    Report Status 12/18/2016 FINAL  Final  Blood Culture ID Panel (Reflexed)     Status: Abnormal   Collection Time: 12/15/16  6:05 PM  Result Value Ref Range Status   Enterococcus species NOT DETECTED NOT DETECTED Final   Listeria monocytogenes NOT DETECTED NOT DETECTED Final   Staphylococcus species NOT DETECTED NOT DETECTED Final   Staphylococcus aureus NOT DETECTED NOT DETECTED Final   Streptococcus species NOT DETECTED NOT DETECTED Final   Streptococcus agalactiae NOT DETECTED NOT DETECTED Final   Streptococcus pneumoniae NOT DETECTED NOT DETECTED Final   Streptococcus pyogenes NOT DETECTED NOT DETECTED Final   Acinetobacter baumannii NOT DETECTED NOT DETECTED Final   Enterobacteriaceae species DETECTED (A) NOT DETECTED Final    Comment: Enterobacteriaceae represent a large family of gram-negative bacteria, not a single organism. CRITICAL RESULT CALLED TO, READ BACK BY AND VERIFIED WITH: M. Mayford KnifeURNER, PHARM, 12/16/16 AT 1205 BY J FUDESCO    Enterobacter cloacae complex NOT DETECTED NOT DETECTED Final  Escherichia coli DETECTED (A) NOT DETECTED Final    Comment: CRITICAL RESULT CALLED TO, READ BACK BY AND VERIFIED WITH: M. TURNER, PHARM, 12/16/16 AT 1205 BY J FUDESCO    Klebsiella oxytoca NOT DETECTED NOT DETECTED Final   Klebsiella pneumoniae NOT DETECTED NOT DETECTED Final   Proteus species NOT DETECTED NOT DETECTED Final   Serratia marcescens NOT DETECTED NOT DETECTED Final   Carbapenem resistance NOT DETECTED NOT DETECTED Final   Haemophilus influenzae NOT DETECTED NOT DETECTED Final   Neisseria meningitidis NOT DETECTED NOT DETECTED Final   Pseudomonas aeruginosa NOT DETECTED NOT DETECTED Final   Candida albicans  NOT DETECTED NOT DETECTED Final   Candida glabrata NOT DETECTED NOT DETECTED Final   Candida krusei NOT DETECTED NOT DETECTED Final   Candida parapsilosis NOT DETECTED NOT DETECTED Final   Candida tropicalis NOT DETECTED NOT DETECTED Final    Comment: Performed at [facility], [address], [place], [place] [address]  Urine culture     Status: Abnormal   Collection Time: 12/15/16  6:14 PM  Result Value Ref Range Status   Specimen Description URINE, CLEAN CATCH  Final   Special Requests NONE  Final   Culture MULTIPLE SPECIES PRESENT, SUGGEST RECOLLECTION (A)  Final   Report Status 12/17/2016 FINAL  Final  MRSA PCR Screening     Status: None   Collection Time: 12/16/16 12:03 AM  Result Value Ref Range Status   MRSA by PCR NEGATIVE NEGATIVE Final    Comment:        The GeneXpert MRSA Assay (FDA approved for NASAL specimens only), is one component of a comprehensive MRSA colonization surveillance program. It is not intended to diagnose MRSA infection nor to guide or monitor treatment for MRSA infections.   Culture, blood (routine x 2)     Status: None (Preliminary result)   Collection Time: 12/18/16  6:01 AM  Result Value Ref Range Status   Specimen Description BLOOD LEFT HAND  Final   Special Requests BOTTLES DRAWN AEROBIC AND ANAEROBIC  5CC  Final   Culture NO GROWTH 1 DAY  Final   Report Status PENDING  Incomplete  Culture, blood (routine x 2)     Status: None (Preliminary result)   Collection Time: 12/18/16  6:09 AM  Result Value Ref Range Status   Specimen Description BLOOD RIGHT HAND  Final   Special Requests BOTTLES DRAWN AEROBIC AND ANAEROBIC  5CC  Final   Culture NO GROWTH 1 DAY  Final   Report Status PENDING  Incomplete     Labs: Basic Metabolic Panel:  Recent Labs Lab 12/16/16 0202 12/17/16 0223 12/18/16 0557 12/19/16 0256 12/19/16 0825 12/20/16 0314  NA 142 144 143 139  --  143  K 3.7 3.8 3.3* 6.2* 3.2* 3.4*  CL 114* 117* 115* 115*  --  116*    CO2 19* 19* 20* 18*  --  20*  GLUCOSE 114* 123* 108* 121*  --  119*  BUN 71* 49* 40* 31*  --  23*  CREATININE 2.21* 1.74* 1.69* 1.52*  --  1.32*  CALCIUM 7.6* 8.0* 8.1* 7.8*  --  8.0*  MG 2.0  --   --   --   --   --   PHOS 3.6  --   --   --   --   --    Liver Function Tests:  Recent Labs Lab 12/16/16 0202 12/19/16 0256  AST 58* 69*  ALT 89* 52  ALKPHOS 136*  113  BILITOT 1.7* 3.0*  PROT 5.5* 5.8*  ALBUMIN 2.1* 1.9*   No results for input(s): LIPASE, AMYLASE in the last 168 hours. No results for input(s): AMMONIA in the last 168 hours. CBC:  Recent Labs Lab 12/16/16 0202 12/17/16 0223 12/18/16 0557 12/19/16 0256 12/20/16 0314  WBC 21.3* 16.3* 15.3* 13.8* 10.8*  HGB 10.2* 11.2* 10.5* 10.5* 9.5*  HCT 30.8* 34.0* 32.0* 31.9* 28.1*  MCV 91.7 91.9 92.0 91.4 90.1  PLT 62* 73* 96* 123* 109*   Cardiac Enzymes:  Recent Labs Lab 12/15/16 1710 12/16/16 0202 12/16/16 0733 12/16/16 1346 12/18/16 0557  CKTOTAL 539* 363  --   --  42*  TROPONINI  --  0.14* 0.09* 0.08*  --    BNP: BNP (last 3 results) No results for input(s): BNP in the last 8760 hours.  ProBNP (last 3 results) No results for input(s): PROBNP in the last 8760 hours.  CBG:  Recent Labs Lab 12/19/16 1222 12/19/16 1554 12/19/16 2112 12/20/16 0034 12/20/16 0827  GLUCAP 130* 121* 112* 119* 98       Signed:  Ciana Simmon  [facility] 12/20/2016, 11:30 AM

## 2016-12-20 NOTE — Plan of Care (Signed)
Problem: Safety: Goal: Ability to remain free from injury will improve Outcome: Progressing Patient demonstrating increased impulsive behavior. Eager to get out of bed. Constant reminders and reorientation necessary.  Problem: Physical Regulation: Goal: Ability to maintain clinical measurements within normal limits will improve Outcome: Progressing Patient maintaining NSR on PO amiodarone.

## 2016-12-20 NOTE — Discharge Instructions (Signed)
Fall Prevention in Hospitals, Adult WHAT ARE SOME SAFETY TIPS FOR PREVENTING FALLS? If you or Travis Bradley loved one has to stay in the hospital, talk with the health care providers about the risk of falling. Find out which medicines or treatments can cause dizziness or affect balance. Make Travis Bradley plan with the health care providers to prevent falls. The plan may include these points:  Ask for help moving around, especially after surgery or when feeling unwell.  Have support available when getting up and moving around.  Wear nonskid footwear.  Use the safety straps on wheelchairs.  Ask for help to get objects that are out of reach.  Wear eyeglasses.  Remove all clutter from the floor and the sides of the bed.  Keep equipment and wires securely out of the way.  Keep the bed locked in the low position.  Keep the side rails up on the bed.  Keep the nurse call button within reach.  Keep the door open when no one else is in the room.  Have someone stay in the hospital with you or your loved one.  Ask for Travis Bradley bed alarm if you are not able to stay with your loved one who is at risk for getting up without help.  Ask if sleeping pills or other medicines that alter mental status are necessary. WHAT INCREASES THE RISK FOR FALLS? Certain conditions and treatments may increase Travis Bradley patient's risk for falls in Travis Bradley hospital. These include:  Being in an unfamiliar environment.  Being on bed rest.  Having surgery.  Taking certain medicines, such as sleeping pills.  Having tubes in place, such as IV lines or catheters. Additional risk factors for falls in Travis Bradley hospital include:  Having vision problems.  Having Travis Bradley change in thinking, feeling, or behavior (altered mental status).  Having trouble with balance.  Needing to use the toilet frequently.  Having fallen in the past three months.  Having low blood pressure when standing up quickly (orthostatic hypotension). WHAT DOES THE HOSPITAL STAFF DO TO HELP  PREVENT ME OR MY LOVED ONE FROM FALLING? Hospitals have systems in place to prevent falls and accidents. Talk with the hospital staff about:  Doing an assessment to discuss fall risks and create Travis Bradley personalized plan to prevent falls.  Checking in regularly to see if help is needed for moving around and to assess any changes in fall risk.  Knowing where the nurse call button is and how to use it. Use this to call Travis Bradley nursing care provider any time.  Keeping personal items within reach. This includes eyeglasses, phones, and other electronic devices.  Following general safety guidelines when moving around.  Keeping the area around the bed free from clutter.  Removing unnecessary equipment or tubes to reduce the risk of tripping.  Using safety equipment, such as:  Walkers, crutches, and other walking devices for support.  Safety rails on the bed.  Safety straps in the bed.  Travis Bradley bed that can be lowered and locked to prevent movement.  Handrails in the bathroom.  Nonskid socks and shoes.  Locking mechanisms to secure equipment in place.  Lifting and transfer equipment. WHAT CAN I DO TO HELP PREVENT Travis Bradley FALL?  Talk with health care providers about fall prevention.  Have Travis Bradley personalized fall prevention plan in place.  Do not try to move around if you feel off balance or ill.  Change position slowly.  Sit on the side of the bed before standing up.  Sit down and call  for help if you feel dizzy or unsteady when standing.  Keep the hospital room clear of clutter. WHEN SHOULD I ASK FOR HELP? Ask for help whenever you:  Are not sure if you are able to move around safely.  Feel dizzy or unsteady.  Are not comfortable helping your loved one move around or use the bathroom. If you or a loved one falls, tell the hospital staff. This is important. This information is not intended to replace advice given to you by your health care provider. Make sure you discuss any questions you have  with your health care provider. Document Released: 09/27/2000 Document Revised: 02/27/2016 Document Reviewed: 07/13/2015 Elsevier Interactive Patient Education  2017 [employer]  Atrial Fibrillation Atrial fibrillation is a type of heartbeat that is irregular or fast (rapid). If you have this condition, your heart keeps quivering in a weird (chaotic) way. This condition can make it so your heart cannot pump blood normally. Having this condition gives a person more risk for stroke, heart failure, and other heart problems. There are different types of atrial fibrillation. Talk with your doctor to learn about the type that you have. Follow these instructions at home:  Take over-the-counter and prescription medicines only as told by your doctor.  If your doctor prescribed a blood-thinning medicine, take it exactly as told. Taking too much of it can cause bleeding. If you do not take enough of it, you will not have the protection that you need against stroke and other problems.  Do not use any tobacco products. These include cigarettes, chewing tobacco, and e-cigarettes. If you need help quitting, ask your doctor.  If you have apnea (obstructive sleep apnea), manage it as told by your doctor.  Do not drink alcohol.  Do not drink beverages that have caffeine. These include coffee, soda, and tea.  Maintain a healthy weight. Do not use diet pills unless your doctor says they are safe for you. Diet pills may make heart problems worse.  Follow diet instructions as told by your doctor.  Exercise regularly as told by your doctor.  Keep all follow-up visits as told by your doctor. This is important. Contact a doctor if:  You notice a change in the speed, rhythm, or strength of your heartbeat.  You are taking a blood-thinning medicine and you notice more bruising.  You get tired more easily when you move or exercise. Get help right away if:  You have pain in your chest or your belly  (abdomen).  You have sweating or weakness.  You feel sick to your stomach (nauseous).  You notice blood in your throw up (vomit), poop (stool), or pee (urine).  You are short of breath.  You suddenly have swollen feet and ankles.  You feel dizzy.  Your suddenly get weak or numb in your face, arms, or legs, especially if it happens on one side of your body.  You have trouble talking, trouble understanding, or both.  Your face or your eyelid droops on one side. These symptoms may be an emergency. Do not wait to see if the symptoms will go away. Get medical help right away. Call your local emergency services (911 in the [place] Do not drive yourself to the hospital. This information is not intended to replace advice given to you by your health care provider. Make sure you discuss any questions you have with your health care provider. Document Released: 07/09/2008 Document Revised: 03/07/2016 Document Reviewed: 01/25/2015 Elsevier Interactive Patient Education  2017 [employer]

## 2016-12-20 NOTE — Progress Notes (Signed)
Occupational Therapy Treatment Patient Details Name: Travis Bradley MRN: [id number] DOB: 06-14-1920 Today's Date: 12/20/2016    History of present illness 83 y.o.maleadmitted following an unwitnessed fall at home. X-rays negative for any orthopedic injury but noted to have atrial fibrillation, hypotension, and dehydration. PMH: dementia, COPD, recent hernia repair (approx. 1 week prior to admission).    OT comments  Pt able to ambulate to sink with RW with min assist, but then requiring seated rest break during which he performed grooming. Pt requiring 2 person assist to rise and steady. Pt moves slowly and reports R hip pain initially, which improves with activity. Pt continues to be appropriate for SNF.  Follow Up Recommendations  SNF;Supervision/Assistance - 24 hour    Equipment Recommendations       Recommendations for Other Services      Precautions / Restrictions Precautions Precautions: Fall Restrictions Weight Bearing Restrictions: No       Mobility Bed Mobility               General bed mobility comments: pt received in recliner  Transfers Overall transfer level: Needs assistance Equipment used: Rolling walker (2 wheeled) Transfers: Sit to/from Stand Sit to Stand: Mod assist;+2 physical assistance         General transfer comment: pt required multiple verbal and tactile cues for sequencing and bilateral hand placement. Mod A x2 to power up into standing from recliner and from straight chair.    Balance Overall balance assessment: Needs assistance Sitting-balance support: Feet supported Sitting balance-Leahy Scale: Fair     Standing balance support: Bilateral upper extremity supported Standing balance-Leahy Scale: Poor Standing balance comment: pt reliant on bilateral UEs on RW                   ADL Overall ADL's : Needs assistance/impaired     Grooming: Wash/dry hands;Wash/dry face;Brushing hair;Sitting;Moderate assistance Grooming  Details (indicate cue type and reason): unable to tolerate standing grooming after ambulating short distance in room                             Functional mobility during ADLs: Minimal assistance;+2 for safety/equipment;Rolling walker;Cueing for sequencing;Cueing for safety        Vision                     Perception     Praxis      Cognition   Behavior During Therapy: [facility] for tasks assessed/performed Overall Cognitive Status: Impaired/Different from baseline Area of Impairment: Attention;Memory;Following commands;Safety/judgement;Problem solving;Awareness;Orientation Orientation Level: Disoriented to;Time   Memory: Decreased short-term memory  Following Commands: Follows one step commands with increased time Safety/Judgement: Decreased awareness of safety;Decreased awareness of deficits   Problem Solving: Slow processing;Decreased initiation;Difficulty sequencing;Requires verbal cues;Requires tactile cues        Exercises General Exercises - Lower Extremity Long Arc Quad: AROM;Strengthening;Both;10 reps;Seated   Shoulder Instructions       General Comments      Pertinent Vitals/ Pain       Pain Assessment: Faces Faces Pain Scale: Hurts little more Pain Location: Rt hip Pain Descriptors / Indicators: Grimacing;Guarding Pain Intervention(s): Monitored during session;Repositioned  Home Living                                          Prior Functioning/Environment  Frequency  Min 2X/week        Progress Toward Goals  OT Goals(current goals can now be found in the care plan section)     Acute Rehab OT Goals Patient Stated Goal: return home Time For Goal Achievement: 01/01/17 Potential to Achieve Goals: Good  Plan Discharge plan remains appropriate    Co-evaluation    PT/OT/SLP Co-Evaluation/Treatment: Yes Reason for Co-Treatment: For patient/therapist safety PT goals addressed during session:  Mobility/safety with mobility;Balance;Proper use of DME;Strengthening/ROM OT goals addressed during session: ADL's and self-care      End of Session Equipment Utilized During Treatment: Gait belt;Rolling walker  OT Visit Diagnosis: Unsteadiness on feet (R26.81);History of falling (Z91.81);Other symptoms and signs involving cognitive function   Activity Tolerance Patient limited by fatigue   Patient Left in chair;with call bell/phone within reach;with chair alarm set   Nurse Communication          Time: [id number] OT Time Calculation (min): 43 min  Charges: OT General Charges $OT Visit: 1 Procedure OT Treatments $Self Care/Home Management : 23-37 mins   Evern BioMayberry, Maddi Collar Lynn 12/20/2016, 1:20 PM  [phone]

## 2016-12-20 NOTE — Progress Notes (Signed)
ANTICOAGULATION CONSULT NOTE - Follow Up Consult  Pharmacy Consult for Apixaban Indication: atrial fibrillation  No Known Allergies  Patient Measurements: Height: 5\' 5"  (165.1 cm) Weight: 136 lb 14.5 oz (62.1 kg) IBW/kg (Calculated) : 61.5  Vital Signs: Temp: 98.7 F (37.1 C) (03/09 0835) Temp Source: Oral (03/09 0835) BP: 137/67 (03/09 0835) Pulse Rate: 95 (03/09 0400)  Labs:  Recent Labs  12/17/16 2341  12/18/16 0557 12/18/16 0830 12/19/16 0256 12/20/16 0314  HGB  --   < > 10.5*  --  10.5* 9.5*  HCT  --   --  32.0*  --  31.9* 28.1*  PLT  --   --  96*  --  123* 109*  HEPARINUNFRC 0.14*  --   --  0.11*  --   --   CREATININE  --   --  1.69*  --  1.52* 1.32*  CKTOTAL  --   --  42*  --   --   --   < > = values in this interval not displayed.  Estimated Creatinine Clearance: 29.8 mL/min (by C-G formula based on SCr of 1.32 mg/dL (H)).   Medications:  Apixaban 2.5mg  BID  Assessment: 83 year old male started on anticoagulation with apixaban for atrial fibrillation that is new during this hospitalization.  He is on a reduced dose regimen due to age 20>80, weight ~60kg, and renal insufficiency.  Note plans for a short duration of anticoagulation (1 month) then discontinue.  Plan:  Continue apixaban 2.5mg  PO BID  Monitor for bleeding complications  Estella HuskMichelle Cayleigh Paull, Pharm.D., BCPS, AAHIVP Clinical Pharmacist Phone: [phone] or [phone] Pager: [phone] 12/20/2016, 10:36 AM

## 2016-12-23 ENCOUNTER — Non-Acute Institutional Stay (SKILLED_NURSING_FACILITY): Payer: Medicare Other | Admitting: Internal Medicine

## 2016-12-23 DIAGNOSIS — R7881 Bacteremia: Secondary | ICD-10-CM | POA: Diagnosis not present

## 2016-12-23 DIAGNOSIS — I480 Paroxysmal atrial fibrillation: Secondary | ICD-10-CM | POA: Diagnosis not present

## 2016-12-23 DIAGNOSIS — J449 Chronic obstructive pulmonary disease, unspecified: Secondary | ICD-10-CM | POA: Diagnosis not present

## 2016-12-23 DIAGNOSIS — G3109 Other frontotemporal dementia: Secondary | ICD-10-CM

## 2016-12-23 DIAGNOSIS — N400 Enlarged prostate without lower urinary tract symptoms: Secondary | ICD-10-CM

## 2016-12-23 DIAGNOSIS — R5381 Other malaise: Secondary | ICD-10-CM

## 2016-12-23 DIAGNOSIS — K219 Gastro-esophageal reflux disease without esophagitis: Secondary | ICD-10-CM

## 2016-12-23 DIAGNOSIS — E43 Unspecified severe protein-calorie malnutrition: Secondary | ICD-10-CM

## 2016-12-23 DIAGNOSIS — F0280 Dementia in other diseases classified elsewhere without behavioral disturbance: Secondary | ICD-10-CM

## 2016-12-23 LAB — CULTURE, BLOOD (ROUTINE X 2)
CULTURE: NO GROWTH
CULTURE: NO GROWTH

## 2016-12-23 NOTE — Progress Notes (Signed)
Patient ID: Travis Bradley, male   DOB: [birth year], 83 y.o.   MRN: [id number]    HISTORY AND PHYSICAL   DATE: 12/23/16  Location:    [facility]   Place of Service:   SNF  Extended Emergency Contact Information Primary Emergency Contact: Townsend,Cathy  [place] Home Phone: [phone] Relation: Daughter  Advanced Directive information  DNR; MOST FORM ON CHART (may have FT)  Chief Complaint  Patient presents with  . New Admit To SNF    from hospital    HPI:  83 yo male seen today as a new admission into SNF following hospital stay for sepsis 2/2 E coli bacteremia, hypotension 2/2 sepsis, afib with RVR, unwitnessed fall, mild rhabdomyolysis with elevated LFTs, AKI, hypokalemia, BPH, anemia, GERD, malnutrition, dementia, s/p inguinal hernia repair 2 mos ago. He had an unwitnessed fall at home and was found by daughter and taken to the ER. Initially tx with IV vanco--> zosyn. Blood cx (+) E coli. IV abx changed to po amoxil 500mg  TID until 3//15/18. ID consulted. Repeat BC NGTD. Also given IVF. 2D echo revealed EF 65-70% with grade 1 DD.  For afib with RVR, he was started on a cardizem gtt-->amio gtt---> po amiodarone. Cardio consulted. He was placed on eliquis and will need 1 mo tx. Left hip xray showed no acute process. CK 539-->42; Hgb 11.8-->9.5; Plts 64K-->109K; WBC peaked 21.3K-->10.8K; albumin 2.1-->1.9; total bilirubin 1.7-->3; K peaked at 6.2-->3.4; Cr 2.64-->1.32 at d/c. He presents to SNF for short term rehab.   Today he reports pain in right hip. No falls since admission into SNF. He is a poor historian due to dementia. Hx obtained from chart. No nursing issues.  BPH - stable on flomax  Dementia - currently not on any medications for cognition. No behavioral disturbance.  Protein calorie malnutrition - albumin 1.9. He gets nutritional supplements per facility protocol. Weight is 117lb. He takes MVI daily  COPD - no recent exacerbation. He uses mucinex BID  and prn HFA. He uses [place] O2 prn  GERD - stable on prilosec 10mg  daily  s/p inguinal hernia repair (early 2018) Past Medical History:  Diagnosis Date  . COPD (chronic obstructive pulmonary disease) (HCC)   . Stomach ulcer   . Stomach ulcer     Past Surgical History:  Procedure Laterality Date  . HERNIA REPAIR      Patient Care Team: Romie Jumper, PA-C as PCP - General (Physician Assistant)  Social History   Social History  . Marital status: Married    Spouse name: N/A  . Number of children: N/A  . Years of education: N/A   Occupational History  . Not on file.   Social History Main Topics  . Smoking status: Never Smoker  . Smokeless tobacco: Never Used  . Alcohol use No  . Drug use: No  . Sexual activity: No   Other Topics Concern  . Not on file   Social History Narrative  . No narrative on file     reports that he has never smoked. He has never used smokeless tobacco. He reports that he does not drink alcohol or use drugs.  No family history on file. Family Status  Relation Status  . Mother Deceased  . Father Deceased  . Maternal Grandmother Deceased  . Maternal Grandfather Deceased  . Paternal Grandmother Deceased  . Paternal Grandfather Deceased     There is no immunization history on file for this patient.  No Known Allergies  Medications: Patient's Medications  New Prescriptions   No medications on file  Previous Medications   ALBUTEROL (PROAIR HFA) 108 (90 BASE) MCG/ACT INHALER    Inhale 2 puffs into the lungs every 6 (six) hours as needed for wheezing.   AMIODARONE (PACERONE) 200 MG TABLET    Take 2 pills twice daily for 6 days (through 3/14). Starting 3/15, take one tablet daily.   AMOXICILLIN (AMOXIL) 500 MG CAPSULE    Take 1 capsule (500 mg total) by mouth 3 (three) times daily.   APIXABAN (ELIQUIS) 2.5 MG TABS TABLET    Take 1 tablet (2.5 mg total) by mouth 2 (two) times daily.   DOCUSATE SODIUM (COLACE) 100 MG CAPSULE    Take 100 mg by  mouth 2 (two) times daily.   FEEDING SUPPLEMENT, ENSURE ENLIVE, (ENSURE ENLIVE) LIQD    Take 237 mLs by mouth 2 (two) times daily between meals.   FISH OIL-OMEGA-3 FATTY ACIDS 1000 MG CAPSULE    Take 2 g by mouth daily.   HYDROCODONE-ACETAMINOPHEN (NORCO/VICODIN) 5-325 MG TABLET    Take 1 tablet by mouth every 6 (six) hours as needed for moderate pain.   MALTODEXTRIN-XANTHAN GUM (RESOURCE THICKENUP CLEAR) POWD    Use as needed to thicken up liquids   MULTIVITAMIN-IRON-MINERALS-FOLIC ACID (CENTRUM) CHEWABLE TABLET    Chew 1 tablet by mouth daily.   NITROGLYCERIN (NITROSTAT) 0.4 MG SL TABLET    Place 0.4 mg under the tongue every 5 (five) minutes as needed for chest pain.   OMEPRAZOLE (PRILOSEC) 10 MG CAPSULE    Take 10 mg by mouth daily.   PSEUDOEPHEDRINE-GUAIFENESIN (MUCINEX D) 60-600 MG PER TABLET    Take 1 tablet by mouth every 12 (twelve) hours.   TAMSULOSIN (FLOMAX) 0.4 MG CAPS CAPSULE    Take 0.4 mg by mouth.  Modified Medications   No medications on file  Discontinued Medications   No medications on file    Review of Systems  Unable to perform ROS: Dementia    Vitals:   12/23/16 1109  BP: (!) 149/62  Pulse: 86  Temp: 97.5 F (36.4 C)  SpO2: 91%  Weight: 117 lb 8 oz (53.3 kg)   Body mass index is 19.55 kg/m.  Physical Exam  Constitutional: He is oriented to person, place, and time. He appears well-developed.  Frail appearing in NAD, sitting in w/c at bedside; very HOH  HENT:  Mouth/Throat: Oropharynx is clear and moist.  Eyes: Pupils are equal, round, and reactive to light. No scleral icterus.  Neck: Neck supple. Carotid bruit is not present. No thyromegaly present.  Cardiovascular: Normal rate, regular rhythm and intact distal pulses.  Exam reveals no gallop and no friction rub.   Murmur (1/6 SEM) heard. no distal LE swelling. No calf TTP  Pulmonary/Chest: Effort normal and breath sounds normal. He has no wheezes. He has no rales. He exhibits no tenderness.    Abdominal: Soft. Bowel sounds are normal. He exhibits no distension, no abdominal bruit, no pulsatile midline mass and no mass. There is no hepatomegaly. There is no tenderness. There is no rebound and no guarding.  Genitourinary:  Genitourinary Comments: Left inguinal incisional scar present  Musculoskeletal: He exhibits edema.  Lymphadenopathy:    He has no cervical adenopathy.  Neurological: He is alert and oriented to person, place, and time. He has normal reflexes.  Skin: Skin is warm and dry. No rash noted.  Psychiatric: He has a normal mood and affect. His behavior is normal. Cognition and  memory are impaired.     Labs reviewed: Admission on 12/15/2016, Discharged on 12/20/2016  No results displayed because visit has over 200 results.  CBC Latest Ref Rng & Units 12/20/2016 12/19/2016 12/18/2016  WBC 4.0 - 10.5 K/uL 10.8(H) 13.8(H) 15.3(H)  Hemoglobin 13.0 - 17.0 g/dL 9.1(Y) 10.5(L) 10.5(L)  Hematocrit 39.0 - 52.0 % 28.1(L) 31.9(L) 32.0(L)  Platelets 150 - 400 K/uL 109(L) 123(L) 96(L)   CMP Latest Ref Rng & Units 12/20/2016 12/19/2016 12/19/2016  Glucose 65 - 99 mg/dL 782(N) - 562(Z)  BUN 6 - 20 mg/dL 30(Q) - 65(H)  Creatinine 0.61 - 1.24 mg/dL 8.46(N) - 6.29(B)  Sodium 135 - 145 mmol/L 143 - 139  Potassium 3.5 - 5.1 mmol/L 3.4(L) 3.2(L) 6.2(H)  Chloride 101 - 111 mmol/L 116(H) - 115(H)  CO2 22 - 32 mmol/L 20(L) - 18(L)  Calcium 8.9 - 10.3 mg/dL 8.0(L) - 7.8(L)  Total Protein 6.5 - 8.1 g/dL - - 5.8(L)  Total Bilirubin 0.3 - 1.2 mg/dL - - 3.0(H)  Alkaline Phos 38 - 126 U/L - - 113  AST 15 - 41 U/L - - 69(H)  ALT 17 - 63 U/L - - 52   No results found for: HGBA1C Lipid Panel  No results found for: CHOL, TRIG, HDL, CHOLHDL, VLDL, LDLCALC, LDLDIRECT      Dg Chest Port 1 View  Result Date: 12/15/2016 CLINICAL DATA:  Possible fall, left hip pain. EXAM: PORTABLE CHEST 1 VIEW COMPARISON:  None. FINDINGS: Heart size and mediastinal contours are within normal limits. Atherosclerotic  changes noted at the aortic arch. Coarse interstitial markings are seen throughout both lungs, of uncertain chronicity, interstitial edema versus chronic interstitial lung disease. Suspect chronic bronchitic changes centrally. No confluent airspace opacity. No pleural effusion or pneumothorax seen. No osseous fracture or dislocation seen. IMPRESSION: 1. Coarse interstitial lung markings throughout both lungs, of uncertain chronicity, interstitial edema versus chronic interstitial lung disease. 2. Suspect chronic bronchitic changes. 3. No evidence of consolidating pneumonia. 4. Aortic atherosclerosis. Electronically Signed   By: Bary Richard M.D.   On: 12/15/2016 20:25   Dg Swallowing Func-speech Pathology  Result Date: 12/19/2016 Objective Swallowing Evaluation: Type of Study: MBS-Modified Barium Swallow Study Patient Details Name: CORT DRAGOO MRN: [id number] Date of Birth: 02-Dec-1919 Today's Date: 12/19/2016 Time: SLP Start Time (ACUTE ONLY): 1035-SLP Stop Time (ACUTE ONLY): 1103 SLP Time Calculation (min) (ACUTE ONLY): 28 min Past Medical History: Past Medical History: Diagnosis Date . COPD (chronic obstructive pulmonary disease) (HCC)  . Stomach ulcer  . Stomach ulcer  Past Surgical History: Past Surgical History: Procedure Laterality Date . HERNIA REPAIR   HPI: 83 y.o.malewith medical history significant of with bph, possible copd was brought to the ed after unwitnessed fall. Dx includes sepsis, hypotension, afib, AKI, mild rhabdo.   Subjective: alert, communicative Assessment / Plan / Recommendation CHL IP CLINICAL IMPRESSIONS 12/19/2016 Clinical Impression Patient with a moderate oropharyngeal dysphagia characterized by generalized weakness of swallowing muscualture including base of tongue,larynx, and pharynx, resulting in decreased laryngeal closure with penetration during the swallow and after the swallow (thin greater than nectar thick liquid) due to moderate-severe pharyngeal residuals. Penetration  trace however and intermittent in occurrance, clearing with combination of spontaneous and cued throat clear. Although penetration occurring more with thin vs nectar thick liquid, residuals greater with thickened liquids increasing risk of post swallow penetration/aspiration. Additionally, risk of aspiration PNA greater with aspiration of nectar thick vs thin liquids. Recommend dysphagia 3, thin liquids, throat clearing every 2 sips.  Will f/u for tolerance and education.  SLP Visit Diagnosis Dysphagia, oropharyngeal phase (R13.12) Attention and concentration deficit following -- Frontal lobe and executive function deficit following -- Impact on safety and function Moderate aspiration risk   CHL IP TREATMENT RECOMMENDATION 12/19/2016 Treatment Recommendations Therapy as outlined in treatment plan below   No flowsheet data found. CHL IP DIET RECOMMENDATION 12/19/2016 SLP Diet Recommendations Dysphagia 3 (Mech soft) solids;Thin liquid Liquid Administration via Cup Medication Administration Whole meds with puree Compensations Slow rate;Small sips/bites;Clear throat intermittently Postural Changes Seated upright at 90 degrees   CHL IP OTHER RECOMMENDATIONS 12/19/2016 Recommended Consults -- Oral Care Recommendations Oral care BID Other Recommendations --   CHL IP FOLLOW UP RECOMMENDATIONS 12/19/2016 Follow up Recommendations Home health SLP   CHL IP FREQUENCY AND DURATION 12/19/2016 Speech Therapy Frequency (ACUTE ONLY) min 2x/week Treatment Duration 2 weeks      CHL IP ORAL PHASE 12/19/2016 Oral Phase WFL Oral - Pudding Teaspoon -- Oral - Pudding Cup -- Oral - Honey Teaspoon -- Oral - Honey Cup -- Oral - Nectar Teaspoon -- Oral - Nectar Cup -- Oral - Nectar Straw -- Oral - Thin Teaspoon -- Oral - Thin Cup -- Oral - Thin Straw -- Oral - Puree -- Oral - Mech Soft -- Oral - Regular -- Oral - Multi-Consistency -- Oral - Pill -- Oral Phase - Comment --  CHL IP PHARYNGEAL PHASE 12/19/2016 Pharyngeal Phase Impaired Pharyngeal- Pudding  Teaspoon -- Pharyngeal -- Pharyngeal- Pudding Cup -- Pharyngeal -- Pharyngeal- Honey Teaspoon -- Pharyngeal -- Pharyngeal- Honey Cup -- Pharyngeal -- Pharyngeal- Nectar Teaspoon Delayed swallow initiation-pyriform sinuses;Reduced pharyngeal peristalsis;Reduced anterior laryngeal mobility;Reduced laryngeal elevation;Reduced airway/laryngeal closure;Reduced tongue base retraction;Pharyngeal residue - valleculae;Pharyngeal residue - pyriform;Lateral channel residue;Penetration/Aspiration before swallow Pharyngeal Material enters airway, CONTACTS cords and not ejected out Pharyngeal- Nectar Cup Reduced pharyngeal peristalsis;Reduced anterior laryngeal mobility;Reduced laryngeal elevation;Reduced airway/laryngeal closure;Reduced tongue base retraction;Pharyngeal residue - valleculae;Pharyngeal residue - pyriform;Lateral channel residue Pharyngeal Material does not enter airway Pharyngeal- Nectar Straw -- Pharyngeal -- Pharyngeal- Thin Teaspoon Reduced pharyngeal peristalsis;Reduced anterior laryngeal mobility;Reduced laryngeal elevation;Reduced airway/laryngeal closure;Reduced tongue base retraction;Pharyngeal residue - valleculae;Pharyngeal residue - pyriform;Lateral channel residue;Penetration/Aspiration during swallow;Penetration/Apiration after swallow Pharyngeal Material enters airway, CONTACTS cords and not ejected out Pharyngeal- Thin Cup Reduced pharyngeal peristalsis;Reduced anterior laryngeal mobility;Reduced laryngeal elevation;Reduced airway/laryngeal closure;Reduced tongue base retraction;Pharyngeal residue - valleculae;Pharyngeal residue - pyriform;Lateral channel residue;Penetration/Aspiration during swallow;Penetration/Apiration after swallow Pharyngeal Material enters airway, CONTACTS cords and not ejected out Pharyngeal- Thin Straw -- Pharyngeal -- Pharyngeal- Puree Reduced pharyngeal peristalsis;Reduced anterior laryngeal mobility;Reduced laryngeal elevation;Reduced airway/laryngeal closure;Reduced  tongue base retraction;Pharyngeal residue - valleculae;Pharyngeal residue - pyriform;Lateral channel residue Pharyngeal -- Pharyngeal- Mechanical Soft Reduced pharyngeal peristalsis;Reduced anterior laryngeal mobility;Reduced laryngeal elevation;Reduced airway/laryngeal closure;Reduced tongue base retraction;Pharyngeal residue - valleculae;Pharyngeal residue - pyriform;Lateral channel residue Pharyngeal -- Pharyngeal- Regular -- Pharyngeal -- Pharyngeal- Multi-consistency -- Pharyngeal -- Pharyngeal- Pill Reduced pharyngeal peristalsis;Reduced anterior laryngeal mobility;Reduced laryngeal elevation;Reduced airway/laryngeal closure;Reduced tongue base retraction;Pharyngeal residue - valleculae;Pharyngeal residue - pyriform;Lateral channel residue Pharyngeal -- Pharyngeal Comment --  Ferdinand Lango MA, CCC-SLP [phone] McCoy Leah Meryl 12/19/2016, 1:35 PM              Dg Hip Unilat W Or W/o Pelvis 2-3 Views Left  Result Date: 12/15/2016 CLINICAL DATA:  Left hip pain after possible fall. EXAM: DG HIP (WITH OR WITHOUT PELVIS) 2-3V LEFT COMPARISON:  None. FINDINGS: Femoral heads are located. Sacroiliac joints are symmetric. No acute fracture. Vascular calcifications. IMPRESSION: No acute osseous abnormality. Electronically Signed  By: Jeronimo Greaves M.D.   On: 12/15/2016 20:25     Assessment/Plan   ICD-9-CM ICD-10-CM   1. PAF (paroxysmal atrial fibrillation) (HCC) 427.31 I48.0   2. Other frontotemporal dementia without behavioral disturbance 331.19 G31.09    294.10 F02.80   3. Severe protein-calorie malnutrition (HCC) 262 E43   4. Bacteremia due to Escherichia coli 790.7 R78.81    041.49     resolved; clinically improving  5. Chronic obstructive pulmonary disease, unspecified COPD type (HCC) 496 J44.9   6. Gastroesophageal reflux disease without esophagitis 530.81 K21.9   7. Benign prostatic hyperplasia, unspecified whether lower urinary tract symptoms present 600.00 N40.0   8. Physical deconditioning  799.3 R53.81      Check CMP, CBC  Use [place] O2 with exertion prn  F/u with cardiology/ afib clinic as scheduled  Complete abx as ordered. STOP DATE 12/26/16  Cont current meds as ordered  Nutritional supplements as ordered  PT/Ot/St as ordered  GOAL: short term rehab and d/c home vs ALF. Communicated with pt and nursing.  Will follow  Marieke Lubke S. Ancil Linsey  [facility] [address] [place], [place] [address] [phone] Cell (Monday-Friday 8 AM - 5 PM) [phone] After 5 PM and follow prompts

## 2016-12-24 ENCOUNTER — Telehealth (HOSPITAL_COMMUNITY): Payer: Self-pay | Admitting: *Deleted

## 2016-12-24 LAB — CBC AND DIFFERENTIAL
HEMATOCRIT: 26 % — AB (ref 41–53)
HEMOGLOBIN: 8.2 g/dL — AB (ref 13.5–17.5)
Platelets: 205 10*3/uL (ref 150–399)
WBC: 9.9 10^3/mL

## 2016-12-24 LAB — BASIC METABOLIC PANEL
BUN: 21 mg/dL (ref 4–21)
Creatinine: 1.2 mg/dL (ref 0.6–1.3)
Glucose: 98 mg/dL
Potassium: 3.3 mmol/L — AB (ref 3.4–5.3)
SODIUM: 142 mmol/L (ref 137–147)

## 2016-12-24 LAB — HEPATIC FUNCTION PANEL
ALK PHOS: 82 U/L (ref 25–125)
ALT: 26 U/L (ref 10–40)
AST: 26 U/L (ref 14–40)
BILIRUBIN, TOTAL: 1.1 mg/dL

## 2016-12-24 NOTE — Telephone Encounter (Signed)
-----   Message from Rhonda G Barrett, PA-C sent at 12/24/2016 11:14 AM EDT ----- Could you call her, she has already gone home.  Thanks RGB ----- Message ----- From: Julieanna Geraci D Amari Burnsworth, CMA Sent: 12/20/2016   9:43 AM To: Rhonda G Barrett, PA-C  This pt has appt made 4/2 at 11:00 am.  Please notify pt.  Thank you   Travis Bradley  

## 2016-12-27 ENCOUNTER — Telehealth (HOSPITAL_COMMUNITY): Payer: Self-pay | Admitting: *Deleted

## 2016-12-27 NOTE — Telephone Encounter (Signed)
-----   Message from Darrol Jumphonda G Barrett, PA-C sent at 12/24/2016 11:14 AM EDT ----- Could you call her, she has already gone home.  Thanks RGB ----- Message ----- From: Awilda BillAmanda D Becker, CMA Sent: 12/20/2016   9:43 AM To: Darrol Jumphonda G Barrett, PA-C  This pt has appt made 4/2 at 11:00 am.  Please notify pt.  Thank you   Marchelle FolksAmanda

## 2016-12-27 NOTE — Telephone Encounter (Signed)
I have attempted x 3 to reach pt regarding appt.  No answer and pt does not have vcml set up on his phone.  Will send letter

## 2017-01-03 ENCOUNTER — Encounter: Payer: Self-pay | Admitting: Adult Health

## 2017-01-03 NOTE — Progress Notes (Signed)
Entered in error

## 2017-01-06 LAB — HEPATIC FUNCTION PANEL
ALK PHOS: 74 U/L (ref 25–125)
ALT: 13 U/L (ref 10–40)
AST: 22 U/L (ref 14–40)
Bilirubin, Total: 0.9 mg/dL

## 2017-01-06 LAB — BASIC METABOLIC PANEL
BUN: 16 mg/dL (ref 4–21)
Creatinine: 1.1 mg/dL (ref 0.6–1.3)
Glucose: 105 mg/dL
Potassium: 3.8 mmol/L (ref 3.4–5.3)
SODIUM: 139 mmol/L (ref 137–147)

## 2017-01-06 LAB — CBC AND DIFFERENTIAL
HEMATOCRIT: 24 % — AB (ref 41–53)
Hemoglobin: 8.1 g/dL — AB (ref 13.5–17.5)
Neutrophils Absolute: 11 /uL
Platelets: 200 10*3/uL (ref 150–399)
WBC: 12.6 10^3/mL

## 2017-01-07 ENCOUNTER — Non-Acute Institutional Stay (SKILLED_NURSING_FACILITY): Payer: Medicare Other | Admitting: Adult Health

## 2017-01-07 ENCOUNTER — Encounter: Payer: Self-pay | Admitting: Adult Health

## 2017-01-07 DIAGNOSIS — R630 Anorexia: Secondary | ICD-10-CM

## 2017-01-07 DIAGNOSIS — J159 Unspecified bacterial pneumonia: Secondary | ICD-10-CM

## 2017-01-07 DIAGNOSIS — A4152 Sepsis due to Pseudomonas: Secondary | ICD-10-CM | POA: Diagnosis not present

## 2017-01-07 NOTE — Progress Notes (Signed)
Location:   [facility] Room Number: 129 B Place of Service:  SNF (31)   CODE STATUS: DNR  Allergies  Allergen Reactions  . Nsaids     Mild renal insufficiency    Chief Complaint  Patient presents with  . Acute Visit    Pneumonia    HPI:  He has been running fevers; has had increased confusion. He chest x-ray does demonstrate pneumonia. His blood cultures are positive. He is unable to participate in the hpi or ros.  Staff reports that he has had poor intake with decreased appetite.    Past Medical History:  Diagnosis Date  . COPD (chronic obstructive pulmonary disease) (HCC)   . Stomach ulcer   . Stomach ulcer     Past Surgical History:  Procedure Laterality Date  . HERNIA REPAIR      Social History   Social History  . Marital status: Married    Spouse name: N/A  . Number of children: N/A  . Years of education: N/A   Occupational History  . Not on file.   Social History Main Topics  . Smoking status: Never Smoker  . Smokeless tobacco: Never Used  . Alcohol use No  . Drug use: No  . Sexual activity: No   Other Topics Concern  . Not on file   Social History Narrative  . No narrative on file   History reviewed. No pertinent family history.    VITAL SIGNS BP 132/70   Pulse 88   Temp 98 F (36.7 C)   Resp (!) 22   Ht 5\' 5"  (1.651 m)   Wt 117 lb 8 oz (53.3 kg)   SpO2 97%   BMI 19.55 kg/m   Patient's Medications  New Prescriptions   No medications on file  Previous Medications   ALBUTEROL (PROAIR HFA) 108 (90 BASE) MCG/ACT INHALER    Inhale 2 puffs into the lungs every 6 (six) hours as needed for wheezing.   APIXABAN (ELIQUIS) 2.5 MG TABS TABLET    Take 1 tablet (2.5 mg total) by mouth 2 (two) times daily.   CEFTRIAXONE (ROCEPHIN) 1 G INJECTION    Inject 1 g into the muscle daily.   DOCUSATE SODIUM (COLACE) 100 MG CAPSULE    Take 100 mg by mouth 2 (two) times daily.   HYDROCODONE-ACETAMINOPHEN (NORCO/VICODIN) 5-325 MG TABLET     Take 1 tablet by mouth every 6 (six) hours as needed for moderate pain.   MULTIVITAMIN-IRON-MINERALS-FOLIC ACID (CENTRUM) CHEWABLE TABLET    Chew 1 tablet by mouth daily.   NITROGLYCERIN (NITROSTAT) 0.4 MG SL TABLET    Place 0.4 mg under the tongue every 5 (five) minutes as needed for chest pain.   NUTRITIONAL SUPPLEMENTS (NUTRITIONAL DRINK PO)    Take by mouth. House 2.0 Med Pass 120 cc by mouth 2 times daily   OMEPRAZOLE (PRILOSEC) 10 MG CAPSULE    Take 10 mg by mouth daily.   TAMSULOSIN (FLOMAX) 0.4 MG CAPS CAPSULE    Take 0.4 mg by mouth.  Modified Medications   No medications on file  Discontinued Medications   FISH OIL-OMEGA-3 FATTY ACIDS 1000 MG CAPSULE    Take 2 g by mouth daily.     SIGNIFICANT DIAGNOSTIC EXAMS  01-06-17: chest x-ray: no focal alveolar infiltrate seen mild nonspecific interstitial prominence which may be either an acute or chronic basis. Differential diagnosis consideration; bronchitis; atypical pneumonia ; chronic interstitial lung disease.   LABS REVIEWED:   01-06-17: wbc 12.6;  hgb 8.1; hct 24.2 mcv 91.4; plt 200; glucose 105; bun 156; creat 1.09; k+ 3.8; na++ 139; liver normal albumin 2.5; blood culture: gram neg rods    Review of Systems  Unable to perform ROS: Dementia    Physical Exam  Constitutional: No distress.  Eyes: Conjunctivae are normal.  Neck: Neck supple. No JVD present. No thyromegaly present.  Cardiovascular: Normal rate, regular rhythm and intact distal pulses.   Respiratory: Effort normal. He has no wheezes.  Increase respiratory rate Has rales and rhonchi  GI: Soft. Bowel sounds are normal. He exhibits no distension. There is no tenderness.  Musculoskeletal: He exhibits no edema.  Able to move all extremities   Lymphadenopathy:    He has no cervical adenopathy.  Neurological: He is alert.  Skin: Skin is warm and dry. He is not diaphoretic.  Psychiatric: He has a normal mood and affect.      ASSESSMENT/ PLAN:  1.  Pneumonia 2. Sepsis 3. anorexia  Insert picc line IVF: NS 75 cc/hr for 1 liter Rocephin 1 gm daily for 14 days remeron 7.5 mg nightly for 30 days for appetite.   MD is aware of resident's narcotic use and is in agreement with current plan of care. We will attempt to wean resident as apropriate   Synthia Innocent NP [facility]  Contact [phone] Monday through Friday 8am- 5pm  After hours call [phone]

## 2017-01-13 ENCOUNTER — Inpatient Hospital Stay (HOSPITAL_COMMUNITY): Admit: 2017-01-13 | Payer: Self-pay | Admitting: Nurse Practitioner

## 2017-01-13 ENCOUNTER — Encounter (HOSPITAL_COMMUNITY): Payer: Self-pay | Admitting: *Deleted

## 2017-01-17 ENCOUNTER — Non-Acute Institutional Stay (SKILLED_NURSING_FACILITY): Payer: Medicare Other | Admitting: Adult Health

## 2017-01-17 ENCOUNTER — Encounter: Payer: Self-pay | Admitting: Adult Health

## 2017-01-17 DIAGNOSIS — A419 Sepsis, unspecified organism: Secondary | ICD-10-CM

## 2017-01-17 DIAGNOSIS — F0280 Dementia in other diseases classified elsewhere without behavioral disturbance: Secondary | ICD-10-CM

## 2017-01-17 DIAGNOSIS — G3109 Other frontotemporal dementia: Secondary | ICD-10-CM | POA: Diagnosis not present

## 2017-01-17 DIAGNOSIS — I4891 Unspecified atrial fibrillation: Secondary | ICD-10-CM | POA: Diagnosis not present

## 2017-01-17 NOTE — Progress Notes (Signed)
Location:   [facility] Room Number: 129B Place of Service:  SNF (31)  Synthia Innocent NP  CODE STATUS: DNR  Allergies  Allergen Reactions  . Nsaids     Mild renal insufficiency    Chief Complaint  Patient presents with  . Discharge Note    Discharged from SNF    HPI:  He is being discharged to assisted living. He will need home health for pt/ot. He will not need dme. He will need his prescriptions to be written and will need to follow up with the medical provider at the receiving facility.    Past Medical History:  Diagnosis Date  . COPD (chronic obstructive pulmonary disease) (HCC)   . Stomach ulcer   . Stomach ulcer     Past Surgical History:  Procedure Laterality Date  . HERNIA REPAIR      Social History   Social History  . Marital status: Married    Spouse name: N/A  . Number of children: N/A  . Years of education: N/A   Occupational History  . Not on file.   Social History Main Topics  . Smoking status: Never Smoker  . Smokeless tobacco: Never Used  . Alcohol use No  . Drug use: No  . Sexual activity: No   Other Topics Concern  . Not on file   Social History Narrative  . No narrative on file   No family history on file.  VITAL SIGNS BP 128/65   Pulse 80   Temp 98 F (36.7 C)   Resp 20   Ht  (1.651 m)   Wt 117 lb 8 oz (53.3 kg)   BMI 19.55 kg/m   Patient's Medications  New Prescriptions   No medications on file  Previous Medications   ALBUTEROL (PROAIR HFA) 108 (90 BASE) MCG/ACT INHALER    Inhale 2 puffs into the lungs every 6 (six) hours as needed for wheezing.   APIXABAN (ELIQUIS) 2.5 MG TABS TABLET    Take 1 tablet (2.5 mg total) by mouth 2 (two) times daily.   DOCUSATE SODIUM (COLACE) 100 MG CAPSULE    Take 100 mg by mouth 2 (two) times daily.    HYDROCODONE-ACETAMINOPHEN (NORCO/VICODIN) 5-325 MG TABLET    Take 1 tablet by mouth every 6 (six) hours as needed for moderate pain.   MIRTAZAPINE (REMERON) 7.5 MG  TABLET    Take 7.5 mg by mouth at bedtime.   MULTIVITAMIN-IRON-MINERALS-FOLIC ACID (CENTRUM) CHEWABLE TABLET    Chew 1 tablet by mouth daily.   NITROGLYCERIN (NITROSTAT) 0.4 MG SL TABLET    Place 0.4 mg under the tongue every 5 (five) minutes as needed for chest pain.   NUTRITIONAL SUPPLEMENTS (NUTRITIONAL DRINK PO)    Take by mouth. House 2.0 Med Pass 120 cc by mouth 2 times daily    NUTRITIONAL SUPPLEMENTS PO    House Supplement with meals for caloric support. Treat w/ meals   OMEGA-3 FATTY ACIDS (FISH OIL) 1000 MG CAPS    Take 2 capsules by mouth daily. 2000 mg = 2 caps   OMEPRAZOLE (PRILOSEC) 10 MG CAPSULE    Take 10 mg by mouth daily.    OXYGEN    Inhale 2 L into the lungs as needed. For shortness of breath   TAMSULOSIN (FLOMAX) 0.4 MG CAPS CAPSULE    Take 0.4 mg by mouth daily.   Modified Medications   No medications on file  Discontinued Medications   No medications on file  SIGNIFICANT DIAGNOSTIC EXAMS    01-06-17: chest x-ray: no focal alveolar infiltrate seen mild nonspecific interstitial prominence which may be either an acute or chronic basis. Differential diagnosis consideration; bronchitis; atypical pneumonia ; chronic interstitial lung disease.   LABS REVIEWED:   01-06-17: wbc 12.6; hgb 8.1; hct 24.2 mcv 91.4; plt 200; glucose 105; bun 156; creat 1.09; k+ 3.8; na++ 139; liver normal albumin 2.5; blood culture: gram neg rods    Review of Systems  Unable to perform ROS: Dementia    Physical Exam  Constitutional: No distress.  Eyes: Conjunctivae are normal.  Neck: Neck supple. No JVD present. No thyromegaly present.  Cardiovascular: Normal rate, regular rhythm and intact distal pulses.   Respiratory: Effort normal. He has no wheezes.  GI: Soft. Bowel sounds are normal. He exhibits no distension. There is no tenderness.  Musculoskeletal: He exhibits no edema.  Able to move all extremities   Lymphadenopathy:    He has no cervical adenopathy.  Neurological: He is  alert.  Skin: Skin is warm and dry. He is not diaphoretic.  Psychiatric: He has a normal mood and affect.    ASSESSMENT/ PLAN:  Patient is being discharged with the following home health services:  Pt/ot: to evaluate and treat as indicated for gait; balance; strength and adl training.   Patient is being discharged with the following durable medical equipment:  None reacquired   Patient has been advised to f/u with their PCP in 1-2 weeks to bring them up to date on their rehab stay.  Social services at facility was responsible for arranging this appointment.  Pt was provided with a 30 day supply of prescriptions for medications and refills must be obtained from their PCP.  For controlled substances, a more limited supply may be provided adequate until PCP appointment only. #10 vicodin 5/325 mg tabs    Time spent with patient  40   minutes >50% time spent counseling; reviewing medical record; tests; labs; and developing future plan of care    Synthia Innocent NP [facility]  Contact [phone] Monday through Friday 8am- 5pm  After hours call [phone]

## 2017-01-21 ENCOUNTER — Ambulatory Visit (HOSPITAL_COMMUNITY)
Admission: RE | Admit: 2017-01-21 | Discharge: 2017-01-21 | Disposition: A | Discharge status: Home / Self Care | Payer: Medicare Other | Source: Ambulatory Visit | Attending: Nurse Practitioner | Admitting: Nurse Practitioner

## 2017-01-21 ENCOUNTER — Encounter (HOSPITAL_COMMUNITY): Payer: Self-pay | Admitting: Nurse Practitioner

## 2017-01-21 VITALS — BP 112/62 | HR 111 | Ht 65.0 in | Wt 106.0 lb

## 2017-01-21 DIAGNOSIS — Z79899 Other long term (current) drug therapy: Secondary | ICD-10-CM | POA: Diagnosis not present

## 2017-01-21 DIAGNOSIS — I480 Paroxysmal atrial fibrillation: Secondary | ICD-10-CM | POA: Diagnosis not present

## 2017-01-21 DIAGNOSIS — Z9889 Other specified postprocedural states: Secondary | ICD-10-CM | POA: Insufficient documentation

## 2017-01-21 DIAGNOSIS — Z8719 Personal history of other diseases of the digestive system: Secondary | ICD-10-CM | POA: Diagnosis not present

## 2017-01-21 DIAGNOSIS — Z7901 Long term (current) use of anticoagulants: Secondary | ICD-10-CM | POA: Insufficient documentation

## 2017-01-21 DIAGNOSIS — Z888 Allergy status to other drugs, medicaments and biological substances status: Secondary | ICD-10-CM | POA: Insufficient documentation

## 2017-01-21 DIAGNOSIS — F0390 Unspecified dementia without behavioral disturbance: Secondary | ICD-10-CM | POA: Insufficient documentation

## 2017-01-21 DIAGNOSIS — J449 Chronic obstructive pulmonary disease, unspecified: Secondary | ICD-10-CM | POA: Insufficient documentation

## 2017-01-22 NOTE — Progress Notes (Signed)
Primary Care Physician: Romie Jumper, PA-C Referring Physician:[facility] F/U   Travis Bradley is a 83 y.o. male with h/o dementia, COPD that was admitted to [facility] 3/4 thru 3/9 with Sepsis, hypotension and paroxysmal afib with RVR. He was placed on IV amiodarone and transitioned to po. He was placed on eliquis 2.5 mg bid to take for one month then return to baby asa daily. He was to f/u in the afib clinic in 3-4 weeks.  In the afib clinc today, pt is accompanied with an attendant from [facility]. He is unable to contribute to the appointment. Ekg shows NSR. Review of his meds sheets shows that she should be on amiodarone 200 mg qd and should be finished with 30 days of  eliquis. There is no amiodarone listed on his MAR and eliquis continues.  He is verbal but not appropriate to answer questions.  Unable to participate in the ROS.  Past Medical History:  Diagnosis Date  . COPD (chronic obstructive pulmonary disease) (HCC)   . Stomach ulcer   . Stomach ulcer    Past Surgical History:  Procedure Laterality Date  . HERNIA REPAIR      Current Outpatient Prescriptions  Medication Sig Dispense Refill  . albuterol (PROAIR HFA) 108 (90 BASE) MCG/ACT inhaler Inhale 2 puffs into the lungs every 6 (six) hours as needed for wheezing.    Marland Kitchen apixaban (ELIQUIS) 2.5 MG TABS tablet Take 1 tablet (2.5 mg total) by mouth 2 (two) times daily. 60 tablet 0  . docusate sodium (COLACE) 100 MG capsule Take 100 mg by mouth 2 (two) times daily.     Marland Kitchen HYDROcodone-acetaminophen (NORCO/VICODIN) 5-325 MG tablet Take 1 tablet by mouth every 6 (six) hours as needed for moderate pain. 20 tablet 0  . mirtazapine (REMERON) 7.5 MG tablet Take 7.5 mg by mouth at bedtime.    . multivitamin-iron-minerals-folic acid (CENTRUM) chewable tablet Chew 1 tablet by mouth daily.    . nitroGLYCERIN (NITROSTAT) 0.4 MG SL tablet Place 0.4 mg under the tongue every 5 (five) minutes as needed for chest pain.    . Nutritional  Supplements (NUTRITIONAL DRINK PO) Take by mouth. House 2.0 Med Pass 120 cc by mouth 2 times daily     . NUTRITIONAL SUPPLEMENTS PO House Supplement with meals for caloric support. Treat w/ meals    . Omega-3 Fatty Acids (FISH OIL) 1000 MG CAPS Take 2 capsules by mouth daily. 2000 mg = 2 caps    . omeprazole (PRILOSEC) 10 MG capsule Take 10 mg by mouth daily.     . OXYGEN Inhale 2 L into the lungs as needed. For shortness of breath    . tamsulosin (FLOMAX) 0.4 MG CAPS capsule Take 0.4 mg by mouth daily.      No current facility-administered medications for this encounter.     Allergies  Allergen Reactions  . Nsaids     Mild renal insufficiency    Social History   Social History  . Marital status: Married    Spouse name: N/A  . Number of children: N/A  . Years of education: N/A   Occupational History  . Not on file.   Social History Main Topics  . Smoking status: Never Smoker  . Smokeless tobacco: Never Used  . Alcohol use No  . Drug use: No  . Sexual activity: No   Other Topics Concern  . Not on file   Social History Narrative  . No narrative on file  No family history on file.  ROS- All systems are reviewed and negative except as per the HPI above  Physical Exam: Vitals:   01/21/17 1133  BP: 112/62  Pulse: (!) 111  Weight: 106 lb (48.1 kg)  Height:  (1.651 m)   Wt Readings from Last 3 Encounters:  01/21/17 106 lb (48.1 kg)  01/17/17 117 lb 8 oz (53.3 kg)  01/07/17 117 lb 8 oz (53.3 kg)    Labs: Lab Results  Component Value Date   NA 139 01/06/2017   K 3.8 01/06/2017   CL 116 (H) 12/20/2016   CO2 20 (L) 12/20/2016   GLUCOSE 119 (H) 12/20/2016   BUN 16 01/06/2017   CREATININE 1.1 01/06/2017   CALCIUM 8.0 (L) 12/20/2016   PHOS 3.6 12/16/2016   MG 2.0 12/16/2016   No results found for: INR No results found for: CHOL, HDL, LDLCALC, TRIG   GEN- The patient is elderly, disheveled , semi alert and oriented to person only Head-  normocephalic, atraumatic Eyes-  Sclera clear, conjunctiva pink Ears- hearing intact Oropharynx- clear Neck- supple, no JVP Lymph- no cervical lymphadenopathy Lungs- Clear to ausculation bilaterally, normal work of breathing Heart- Regular rate and rhythm, no murmurs, rubs or gallops, PMI not laterally displaced GI- soft, NT, ND, + BS Extremities- no clubbing, cyanosis, or edema MS- no significant deformity or atrophy Skin- no rash or lesion Psych- euthymic mood, full affect Neuro- strength and sensation are intact  EKG-Sinus tach at 111 bpm, pr int 134 ms, qrs int 60 ms, qtc 454 ms Epic records reviewed    Assessment and Plan: 1. PAF In SR, amiodarone is not on med sheet Called and talked to med nurse at [facility] and she was to investigate why this is not being given. Also, eliquis can be stopped since it is now 30 days s/p hospitalization, at which time, it was to be stopped and baby asa to be resumed. Will await for med nurse to call me back for further instructions  Lupita Leash C. [facility] [address] [place], [place] [address] [phone]

## 2017-01-31 DIAGNOSIS — Z79891 Long term (current) use of opiate analgesic: Secondary | ICD-10-CM | POA: Diagnosis not present

## 2017-01-31 DIAGNOSIS — Z7901 Long term (current) use of anticoagulants: Secondary | ICD-10-CM | POA: Diagnosis not present

## 2017-01-31 DIAGNOSIS — I480 Paroxysmal atrial fibrillation: Secondary | ICD-10-CM | POA: Diagnosis not present

## 2017-01-31 DIAGNOSIS — Z9181 History of falling: Secondary | ICD-10-CM | POA: Diagnosis not present

## 2017-01-31 DIAGNOSIS — F0390 Unspecified dementia without behavioral disturbance: Secondary | ICD-10-CM | POA: Diagnosis not present

## 2017-01-31 DIAGNOSIS — R269 Unspecified abnormalities of gait and mobility: Secondary | ICD-10-CM | POA: Diagnosis not present

## 2017-01-31 DIAGNOSIS — J449 Chronic obstructive pulmonary disease, unspecified: Secondary | ICD-10-CM | POA: Diagnosis not present

## 2017-02-03 ENCOUNTER — Inpatient Hospital Stay (HOSPITAL_COMMUNITY)
Admission: EM | Admit: 2017-02-03 | Discharge: 2017-02-11 | DRG: 871 | Disposition: E | Payer: Medicare Other | Attending: Family Medicine | Admitting: Family Medicine

## 2017-02-03 ENCOUNTER — Emergency Department (HOSPITAL_COMMUNITY): Payer: Medicare Other

## 2017-02-03 ENCOUNTER — Encounter (HOSPITAL_COMMUNITY): Payer: Self-pay | Admitting: Emergency Medicine

## 2017-02-03 DIAGNOSIS — I480 Paroxysmal atrial fibrillation: Secondary | ICD-10-CM | POA: Diagnosis present

## 2017-02-03 DIAGNOSIS — A419 Sepsis, unspecified organism: Secondary | ICD-10-CM | POA: Diagnosis not present

## 2017-02-03 DIAGNOSIS — R402212 Coma scale, best verbal response, none, at arrival to emergency department: Secondary | ICD-10-CM | POA: Diagnosis present

## 2017-02-03 DIAGNOSIS — Z515 Encounter for palliative care: Secondary | ICD-10-CM | POA: Diagnosis not present

## 2017-02-03 DIAGNOSIS — R6521 Severe sepsis with septic shock: Secondary | ICD-10-CM | POA: Diagnosis present

## 2017-02-03 DIAGNOSIS — A4151 Sepsis due to Escherichia coli [E. coli]: Secondary | ICD-10-CM | POA: Diagnosis not present

## 2017-02-03 DIAGNOSIS — Z681 Body mass index (BMI) 19 or less, adult: Secondary | ICD-10-CM

## 2017-02-03 DIAGNOSIS — Z9981 Dependence on supplemental oxygen: Secondary | ICD-10-CM

## 2017-02-03 DIAGNOSIS — E872 Acidosis: Secondary | ICD-10-CM | POA: Diagnosis present

## 2017-02-03 DIAGNOSIS — G3109 Other frontotemporal dementia: Secondary | ICD-10-CM | POA: Diagnosis present

## 2017-02-03 DIAGNOSIS — Z8711 Personal history of peptic ulcer disease: Secondary | ICD-10-CM

## 2017-02-03 DIAGNOSIS — I959 Hypotension, unspecified: Secondary | ICD-10-CM | POA: Diagnosis present

## 2017-02-03 DIAGNOSIS — Z7901 Long term (current) use of anticoagulants: Secondary | ICD-10-CM

## 2017-02-03 DIAGNOSIS — N179 Acute kidney failure, unspecified: Secondary | ICD-10-CM | POA: Diagnosis present

## 2017-02-03 DIAGNOSIS — R64 Cachexia: Secondary | ICD-10-CM | POA: Diagnosis present

## 2017-02-03 DIAGNOSIS — J440 Chronic obstructive pulmonary disease with acute lower respiratory infection: Secondary | ICD-10-CM | POA: Diagnosis present

## 2017-02-03 DIAGNOSIS — J189 Pneumonia, unspecified organism: Secondary | ICD-10-CM | POA: Diagnosis present

## 2017-02-03 DIAGNOSIS — R402352 Coma scale, best motor response, localizes pain, at arrival to emergency department: Secondary | ICD-10-CM | POA: Diagnosis present

## 2017-02-03 DIAGNOSIS — Z66 Do not resuscitate: Secondary | ICD-10-CM | POA: Diagnosis present

## 2017-02-03 DIAGNOSIS — R402112 Coma scale, eyes open, never, at arrival to emergency department: Secondary | ICD-10-CM | POA: Diagnosis present

## 2017-02-03 DIAGNOSIS — R0603 Acute respiratory distress: Secondary | ICD-10-CM | POA: Diagnosis present

## 2017-02-03 DIAGNOSIS — Z79899 Other long term (current) drug therapy: Secondary | ICD-10-CM

## 2017-02-03 HISTORY — DX: Pneumonia, unspecified organism: J18.9

## 2017-02-03 HISTORY — DX: Unspecified dementia, unspecified severity, without behavioral disturbance, psychotic disturbance, mood disturbance, and anxiety: F03.90

## 2017-02-03 HISTORY — DX: Unspecified atrial fibrillation: I48.91

## 2017-02-03 HISTORY — DX: Unspecified hearing loss, unspecified ear: H91.90

## 2017-02-03 HISTORY — DX: Emphysema, unspecified: J43.9

## 2017-02-03 HISTORY — DX: Repeated falls: R29.6

## 2017-02-03 HISTORY — DX: Dependence on supplemental oxygen: Z99.81

## 2017-02-03 HISTORY — DX: Benign prostatic hyperplasia without lower urinary tract symptoms: N40.0

## 2017-02-03 HISTORY — DX: Unspecified macular degeneration: H35.30

## 2017-02-03 LAB — I-STAT ARTERIAL BLOOD GAS, ED
Acid-base deficit: 7 mmol/L — ABNORMAL HIGH (ref 0.0–2.0)
Bicarbonate: 16.3 mmol/L — ABNORMAL LOW (ref 20.0–28.0)
O2 SAT: 87 %
PCO2 ART: 24.2 mmHg — AB (ref 32.0–48.0)
Patient temperature: 100.5
TCO2: 17 mmol/L (ref 0–100)
pH, Arterial: 7.44 (ref 7.350–7.450)
pO2, Arterial: 52 mmHg — ABNORMAL LOW (ref 83.0–108.0)

## 2017-02-03 LAB — I-STAT CG4 LACTIC ACID, ED: LACTIC ACID, VENOUS: 5.61 mmol/L — AB (ref 0.5–1.9)

## 2017-02-03 LAB — COMPREHENSIVE METABOLIC PANEL
ALBUMIN: 2.1 g/dL — AB (ref 3.5–5.0)
ALT: 32 U/L (ref 17–63)
ANION GAP: 20 — AB (ref 5–15)
AST: 109 U/L — ABNORMAL HIGH (ref 15–41)
Alkaline Phosphatase: 70 U/L (ref 38–126)
BUN: 65 mg/dL — ABNORMAL HIGH (ref 6–20)
CO2: 17 mmol/L — AB (ref 22–32)
Calcium: 8 mg/dL — ABNORMAL LOW (ref 8.9–10.3)
Chloride: 110 mmol/L (ref 101–111)
Creatinine, Ser: 3.02 mg/dL — ABNORMAL HIGH (ref 0.61–1.24)
GFR calc non Af Amer: 16 mL/min — ABNORMAL LOW (ref >60)
GFR, EST AFRICAN AMERICAN: 19 mL/min — AB (ref >60)
GLUCOSE: 109 mg/dL — AB (ref 65–99)
POTASSIUM: 3.8 mmol/L (ref 3.5–5.1)
SODIUM: 147 mmol/L — AB (ref 135–145)
Total Bilirubin: 1.1 mg/dL (ref 0.3–1.2)
Total Protein: 6.6 g/dL (ref 6.5–8.1)

## 2017-02-03 LAB — CBC WITH DIFFERENTIAL/PLATELET
Basophils Absolute: 0 10*3/uL (ref 0.0–0.1)
Basophils Relative: 0 %
EOS PCT: 0 %
Eosinophils Absolute: 0 10*3/uL (ref 0.0–0.7)
HEMATOCRIT: 28.5 % — AB (ref 39.0–52.0)
Hemoglobin: 8.8 g/dL — ABNORMAL LOW (ref 13.0–17.0)
Lymphocytes Relative: 5 %
Lymphs Abs: 1.2 10*3/uL (ref 0.7–4.0)
MCH: 27.2 pg (ref 26.0–34.0)
MCHC: 30.9 g/dL (ref 30.0–36.0)
MCV: 88 fL (ref 78.0–100.0)
MONOS PCT: 3 %
Monocytes Absolute: 0.7 10*3/uL (ref 0.1–1.0)
NEUTROS PCT: 92 %
Neutro Abs: 22.8 10*3/uL — ABNORMAL HIGH (ref 1.7–7.7)
Platelets: 194 10*3/uL (ref 150–400)
RBC: 3.24 MIL/uL — AB (ref 4.22–5.81)
RDW: 16.4 % — ABNORMAL HIGH (ref 11.5–15.5)
WBC: 24.7 10*3/uL — AB (ref 4.0–10.5)

## 2017-02-03 LAB — I-STAT TROPONIN, ED: Troponin i, poc: 0.33 ng/mL (ref 0.00–0.08)

## 2017-02-03 LAB — URINALYSIS, ROUTINE W REFLEX MICROSCOPIC
Bilirubin Urine: NEGATIVE
GLUCOSE, UA: NEGATIVE mg/dL
KETONES UR: NEGATIVE mg/dL
LEUKOCYTES UA: NEGATIVE
Nitrite: NEGATIVE
PROTEIN: 30 mg/dL — AB
Specific Gravity, Urine: 1.017 (ref 1.005–1.030)
pH: 5 (ref 5.0–8.0)

## 2017-02-03 LAB — MRSA PCR SCREENING: MRSA by PCR: NEGATIVE

## 2017-02-03 LAB — D-DIMER, QUANTITATIVE: D-Dimer, Quant: 7.7 ug/mL-FEU — ABNORMAL HIGH (ref 0.00–0.50)

## 2017-02-03 MED ORDER — ACETAMINOPHEN 650 MG RE SUPP
650.0000 mg | Freq: Once | RECTAL | Status: AC
  Administered 2017-02-03: 650 mg via RECTAL
  Filled 2017-02-03: qty 1

## 2017-02-03 MED ORDER — SODIUM CHLORIDE 0.9 % IV BOLUS (SEPSIS)
500.0000 mL | Freq: Once | INTRAVENOUS | Status: AC
  Administered 2017-02-03: 500 mL via INTRAVENOUS

## 2017-02-03 MED ORDER — LORAZEPAM 2 MG/ML IJ SOLN: 0.5000 mg | INTRAMUSCULAR | Status: DC | PRN

## 2017-02-03 MED ORDER — PIPERACILLIN-TAZOBACTAM 3.375 G IVPB 30 MIN
3.3750 g | Freq: Once | INTRAVENOUS | Status: AC
  Administered 2017-02-03: 3.375 g via INTRAVENOUS
  Filled 2017-02-03: qty 50

## 2017-02-03 MED ORDER — NOREPINEPHRINE BITARTRATE 1 MG/ML IV SOLN
0.0000 ug/min | INTRAVENOUS | Status: DC
  Administered 2017-02-03: 2 ug/min via INTRAVENOUS
  Filled 2017-02-03: qty 4

## 2017-02-03 MED ORDER — ONDANSETRON HCL 4 MG/2ML IJ SOLN: 4.0000 mg | Freq: Three times a day (TID) | INTRAMUSCULAR | Status: DC | PRN

## 2017-02-03 MED ORDER — IPRATROPIUM-ALBUTEROL 0.5-2.5 (3) MG/3ML IN SOLN
3.0000 mL | Freq: Four times a day (QID) | RESPIRATORY_TRACT | Status: DC
  Administered 2017-02-03: 3 mL via RESPIRATORY_TRACT
  Filled 2017-02-03 (×2): qty 3

## 2017-02-03 MED ORDER — VANCOMYCIN HCL 500 MG IV SOLR: 500.0000 mg | INTRAVENOUS | Status: DC

## 2017-02-03 MED ORDER — VANCOMYCIN HCL IN DEXTROSE 1-5 GM/200ML-% IV SOLN
1000.0000 mg | Freq: Once | INTRAVENOUS | Status: AC
  Administered 2017-02-03: 1000 mg via INTRAVENOUS
  Filled 2017-02-03: qty 200

## 2017-02-03 MED ORDER — HYDROMORPHONE HCL 1 MG/ML IJ SOLN: 0.5000 mg | INTRAMUSCULAR | Status: AC

## 2017-02-03 MED ORDER — IPRATROPIUM-ALBUTEROL 0.5-2.5 (3) MG/3ML IN SOLN
3.0000 mL | Freq: Three times a day (TID) | RESPIRATORY_TRACT | Status: DC
  Administered 2017-02-04: 3 mL via RESPIRATORY_TRACT
  Filled 2017-02-03: qty 3

## 2017-02-03 MED ORDER — NOREPINEPHRINE BITARTRATE 1 MG/ML IV SOLN
0.0000 ug/min | Freq: Once | INTRAVENOUS | Status: DC
  Filled 2017-02-03: qty 4

## 2017-02-03 MED ORDER — PIPERACILLIN-TAZOBACTAM IN DEX 2-0.25 GM/50ML IV SOLN
2.2500 g | Freq: Three times a day (TID) | INTRAVENOUS | Status: DC
  Filled 2017-02-03: qty 50

## 2017-02-03 MED ORDER — HYDROMORPHONE HCL 1 MG/ML IJ SOLN
0.5000 mg | INTRAMUSCULAR | Status: DC | PRN
  Administered 2017-02-04: 0.5 mg via INTRAVENOUS
  Filled 2017-02-03: qty 1

## 2017-02-03 MED ORDER — IPRATROPIUM-ALBUTEROL 0.5-2.5 (3) MG/3ML IN SOLN
3.0000 mL | Freq: Once | RESPIRATORY_TRACT | Status: AC
  Administered 2017-02-03: 3 mL via RESPIRATORY_TRACT
  Filled 2017-02-03: qty 3

## 2017-02-03 NOTE — H&P (Signed)
Family Medicine Teaching [facility] Admission History and Physical Service Pager: [phone]  [name] Medical record number: [id number] Date of birth: 1919/11/17 Age: 83 y.o. Gender: male  Primary Care Provider: Romie Jumper, PA-C Consultants: Palliative  Code Status: DNR  Chief Complaint: AMS  Assessment and Plan: Travis Bradley is a 83 y.o. male presenting with AMS, tachypnea, hypotension, tachycardia with septic shock from likely PNA . PMH is significant for Dementia, Paroxysmal Atrial Fibrillation, history of sepsis secondary to E. Coli Bacteremia (3/4-9/18), Malnutrition  Septic Shock: likely source is PNA. Tachycardic to 160s to 200s, hypotensive with SBP 78 and DBP 42. Family decided on comfort care. Discussed with palliative care.  - admit to med surg, attending Dr. Deirdre Priest - comfort care only - Palliative medicine consulted appreciate recommendations: dilaudid 0.5mg  q 1 hr  - zofran PRN - duoneb q6hr - ativan 0.5 q 4 hr PRN  FEN/GI: NPO or patient's preference Prophylaxis: none  Disposition: admit for comfort care   History of Present Illness:  Travis Bradley is a 83 y.o. male presenting with AMS.   Daughter reports that patient was admitted for sepsis in March 2018 and was discharged to SNF. Reports since then he has not been doing well. He stopped eating and started losing weight. He also developed a bout of pneumonia. He was then transferred to [facility] to be closer to family about 1 week ago. His health continued to decline overall and reports that he always seem to be in pain. Per his daughter, patient was very clear that he never wanted any "life support" for himself.  Patient was brought by EMS after nursing home noted of AMS and hypotension. Oxygen saturation 88% on Nasal Canula, 92% on NRB.   In the ED, patient noted to be febrile to 100.59F, tachycardic to 160s, tachypneic in 40s, hypotensive to 60s/40s. Was placed on  non-rebreather. CXR with patchy opacity in the LLL concerning for PNA. Has AKI with Cr. 3.02 (baseline 1.1), metabolic acidosis with bicarb 17, lactic acid to 5.61 ,d-dimer 7.70, istat troponin 0.33. ABG with normal pH, low O2 at 52, low Co2 24. UA without signs of infection. EKG with sinus tachycardia. Patient given 1L bolus, vanc and zosyn x 1 and started on Levophed. Palliative care consulted in the ED and patient was made comfort care by the daughter.    Review Of Systems:   ROS: unable to obtain as patient not awake or alert  Patient Active Problem List   Diagnosis Date Noted  . Other frontotemporal dementia 12/18/2016  . Sepsis (HCC)   . Bacteremia   . Goals of care, counseling/discussion   . Palliative care encounter   . Atrial fibrillation with RVR (HCC) 12/16/2016  . Fall 12/16/2016  . Dementia 12/16/2016  . Hypotension 12/15/2016  . Dehydration 12/15/2016    Past Medical History: Past Medical History:  Diagnosis Date  . COPD (chronic obstructive pulmonary disease) (HCC)   . Stomach ulcer   . Stomach ulcer     Past Surgical History: Past Surgical History:  Procedure Laterality Date  . HERNIA REPAIR      Social History: Social History  Substance Use Topics  . Smoking status: Never Smoker  . Smokeless tobacco: Never Used  . Alcohol use No   Additional social history:  Please also refer to relevant sections of EMR.  Family History: History reviewed. No pertinent family history.   Allergies and Medications: Allergies  Allergen Reactions  . Nsaids  Other (See Comments)    Mild renal insufficiency   No current facility-administered medications on file prior to encounter.    Current Outpatient Prescriptions on File Prior to Encounter  Medication Sig Dispense Refill  . albuterol (PROAIR HFA) 108 (90 BASE) MCG/ACT inhaler Inhale 2 puffs into the lungs every 6 (six) hours as needed for wheezing.    Marland Kitchen apixaban (ELIQUIS) 2.5 MG TABS tablet Take 1 tablet (2.5 mg  total) by mouth 2 (two) times daily. 60 tablet 0  . docusate sodium (COLACE) 100 MG capsule Take 100 mg by mouth 2 (two) times daily.     Marland Kitchen HYDROcodone-acetaminophen (NORCO/VICODIN) 5-325 MG tablet Take 1 tablet by mouth every 6 (six) hours as needed for moderate pain. 20 tablet 0  . mirtazapine (REMERON) 7.5 MG tablet Take 7.5 mg by mouth at bedtime.    . nitroGLYCERIN (NITROSTAT) 0.4 MG SL tablet Place 0.4 mg under the tongue every 5 (five) minutes as needed for chest pain.    . Nutritional Supplements (NUTRITIONAL DRINK PO) Take 120 mLs by mouth See admin instructions. House 2.0 Med Pass 120 cc by mouth 2 times daily     . NUTRITIONAL SUPPLEMENTS PO House Supplement with meals for caloric support. Treat w/ meals    . Omega-3 Fatty Acids (FISH OIL) 1000 MG CAPS Take 2,000 mg by mouth daily.     Marland Kitchen omeprazole (PRILOSEC) 10 MG capsule Take 10 mg by mouth daily.     . tamsulosin (FLOMAX) 0.4 MG CAPS capsule Take 0.4 mg by mouth daily.     . OXYGEN Inhale 2 L into the lungs as needed. For shortness of breath      Objective: BP 102/66   Pulse (!) 55   Temp (!) 100.5 F (38.1 C) (Rectal)   Resp (!) 26   Ht  (1.651 m)   Wt 48.1 kg (106 lb)   SpO2 99%   BMI 17.64 kg/m  Exam: General: not awake or alert, in some respiratory distress on non-rebreather Eyes: eyes closed ENTM: unable to examine due to non-rebreather Cardiovascular: tachycardia, regular rhythm Respiratory: on non-rebreather, coarse breath sounds bilaterally  Gastrointestinal: soft, NT, ND, + BS Neuro: GCS 7 (eye 1, verbal 1, motor 5)    Labs and Imaging: CBC BMET   Recent Labs Lab 2017/02/14 1154  WBC 24.7*  HGB 8.8*  HCT 28.5*  PLT 194    Recent Labs Lab Feb 14, 2017 1154  NA 147*  K 3.8  CL 110  CO2 17*  BUN 65*  CREATININE 3.02*  GLUCOSE 109*  CALCIUM 8.0*    Lactic Acid: 5.61 Ddimer 7.7 ABG: ph 7.44, o2 52, co2 24  CXR: IMPRESSION: COPD. Interstitial prominence, which may reflect  chronic interstitial lung disease.  Patchy left perihilar and lower lung airspace opacities concerning for pneumonia.  Palma Holter, MD 02/14/17, 3:47 PM PGY-2, [facility] FPTS Intern pager: [phone], text pages welcome

## 2017-02-03 NOTE — Consult Note (Signed)
Consultation Note Date: 2017/02/27   [name]  DOB: [birth year]  MRN: [id number]  Age / Sex: 83 y.o., male  PCP: Romie Jumper, PA-C Referring Physician: Carney Living, MD  Reason for Consultation: Establishing goals of care and Terminal Care  HPI/Patient Profile: 83 y.o. male with recent fall/hip fx, PNA and failure to thrive. Has been at SNF for rehab-complicated by deirium and agitatiion-failure to progress and loss of functional status. Brought to ED with presumed sepsis-once family arrived a decision was made to shift to full comfort. FPTS to admit for comfort care.   Clinical Assessment and Goals of Care: Prognosis is hours-days. Comfort measures only.  NEXT OF KIN-daughter Olegario Messier    SUMMARY OF RECOMMENDATIONS    Code Status/Advance Care Planning:  DNR    Symptom Management:   Ativan 0.5 q2 PRN  Dilaudid 0.5mg  q1 prn  Palliative Prophylaxis Orders  Palliative Prophylaxis:   Aspiration, Delirium Protocol, Frequent Pain Assessment, Oral Care and Turn Reposition  Additional Recommendations (Limitations, Scope, Preferences):  Full Comfort Care  Psycho-social/Spiritual:   Desire for further Chaplaincy support:no  Additional Recommendations: Grief/Bereavement Support  Prognosis:   Hours - Days  Discharge Planning: Anticipated Hospital Death      Primary Diagnoses: Present on Admission: . Sepsis (HCC)   I have reviewed the medical record, interviewed the patient and family, and examined the patient. The following aspects are pertinent.  Past Medical History:  Diagnosis Date  . COPD (chronic obstructive pulmonary disease) (HCC)   . Stomach ulcer   . Stomach ulcer    Social History   Social History  . Marital status: Married    Spouse name: N/A  . Number of children: N/A  . Years of education: N/A   Social History Main Topics  . Smoking  status: Never Smoker  . Smokeless tobacco: Never Used  . Alcohol use No  . Drug use: No  . Sexual activity: No   Other Topics Concern  . None   Social History Narrative  . None   History reviewed. No pertinent family history. Scheduled Meds: .  HYDROmorphone (DILAUDID) injection  0.5 mg Intravenous STAT  . ipratropium-albuterol  3 mL Nebulization Q6H   Continuous Infusions: PRN Meds:.HYDROmorphone (DILAUDID) injection, LORazepam, ondansetron (ZOFRAN) IV Medications Prior to Admission:  Prior to Admission medications   Medication Sig Start Date End Date Taking? Authorizing Provider  albuterol (PROAIR HFA) 108 (90 BASE) MCG/ACT inhaler Inhale 2 puffs into the lungs every 6 (six) hours as needed for wheezing.   Yes Historical Provider, MD  apixaban (ELIQUIS) 2.5 MG TABS tablet Take 1 tablet (2.5 mg total) by mouth 2 (two) times daily. 12/20/16  Yes Maryann Mikhail, DO  docusate sodium (COLACE) 100 MG capsule Take 100 mg by mouth 2 (two) times daily.    Yes Historical Provider, MD  HYDROcodone-acetaminophen (NORCO/VICODIN) 5-325 MG tablet Take 1 tablet by mouth every 6 (six) hours as needed for moderate pain. 12/20/16  Yes Maryann Mikhail, DO  mirtazapine (REMERON) 7.5 MG  tablet Take 7.5 mg by mouth at bedtime.   Yes Historical Provider, MD  Multiple Vitamins-Minerals (CERTAVITE/ANTIOXIDANTS PO) Take 1 tablet by mouth daily.   Yes Historical Provider, MD  nitroGLYCERIN (NITROSTAT) 0.4 MG SL tablet Place 0.4 mg under the tongue every 5 (five) minutes as needed for chest pain.   Yes Historical Provider, MD  Nutritional Supplements (NUTRITIONAL DRINK PO) Take 120 mLs by mouth See admin instructions. House 2.0 Med Pass 120 cc by mouth 2 times daily    Yes Historical Provider, MD  NUTRITIONAL SUPPLEMENTS PO House Supplement with meals for caloric support. Treat w/ meals   Yes Historical Provider, MD  Omega-3 Fatty Acids (FISH OIL) 1000 MG CAPS Take 2,000 mg by mouth daily.    Yes Historical  Provider, MD  omeprazole (PRILOSEC) 10 MG capsule Take 10 mg by mouth daily.    Yes Historical Provider, MD  tamsulosin (FLOMAX) 0.4 MG CAPS capsule Take 0.4 mg by mouth daily.    Yes Historical Provider, MD  OXYGEN Inhale 2 L into the lungs as needed. For shortness of breath    Historical Provider, MD   Allergies  Allergen Reactions  . Nsaids Other (See Comments)    Mild renal insufficiency   Review of Systems  Physical Exam  Vital Signs: BP 102/66   Pulse (!) 55   Temp (!) 100.5 F (38.1 C) (Rectal)   Resp (!) 26   Ht  (1.651 m)   Wt 48.1 kg (106 lb)   SpO2 99%   BMI 17.64 kg/m  Pain Assessment: 0-10   Pain Score: Asleep   SpO2: SpO2: 99 % O2 Device:SpO2: 99 % O2 Flow Rate: .O2 Flow Rate (L/min): 10 L/min  IO: Intake/output summary:  Intake/Output Summary (Last 24 hours) at 02/02/2017 1621 Last data filed at 01/27/2017 1418  Gross per 24 hour  Intake             1000 ml  Output                0 ml  Net             1000 ml    LBM:   Baseline Weight: Weight: 48.1 kg (106 lb) Most recent weight: Weight: 48.1 kg (106 lb)     Palliative Assessment/Data:   Flowsheet Rows     Most Recent Value  Intake Tab  Referral Department  -- [EDP]  Unit at Time of Referral  ER  Date Notified  02/01/2017  Palliative Care Type  New Palliative care  Reason for referral  Clarify Goals of Care, End of Life Care Assistance  Date of Admission  02/08/2017  Date first seen by Palliative Care  01/18/2017  # of days Palliative referral response time  0 Day(s)  # of days IP prior to Palliative referral  0  Clinical Assessment  Palliative Performance Scale Score  10%  Pain Max last 24 hours  Not able to report  Pain Min Last 24 hours  Not able to report  Dyspnea Max Last 24 Hours  Not able to report  Dyspnea Min Last 24 hours  Not able to report  Nausea Max Last 24 Hours  Not able to report  Nausea Min Last 24 Hours  Not able to report  Anxiety Max Last 24 Hours  Not able to report    Anxiety Min Last 24 Hours  Not able to report  Other Max Last 24 Hours  Not able to report  Psychosocial & Spiritual Assessment  Palliative Care Outcomes      Time In: 3:45 Time Out: 4:35   Time Total: 50 minutes Greater than 50%  of this time was spent counseling and coordinating care related to the above assessment and plan.  Signed by: Anderson Malta, DO   Please contact Palliative Medicine Team phone at [phone] for questions and concerns.  For individual provider: See Loretha Stapler

## 2017-02-03 NOTE — ED Triage Notes (Signed)
Per EMS: pt from [facility] c/o AMS and hypotension this am; GCS 9; pt given 1 L fluid and now SBP 112; pt with hx of sepsis; IV 18g R AC; CBG 139; pt O2 sats 88% on [place]; pt 92% on NRB

## 2017-02-03 NOTE — Progress Notes (Signed)
New Admission Note:   Arrival Method: Bed Mental Orientation: Unresponsive Telemetry: Not ordered Assessment: Completed Skin: See flowsheets.  IV: Clean, Dry, Intact Pain: Faces: 0 Admission: Admission nurse called.  Unit Orientation: Patient unresponsive.  Family: At bedside.   Orders have been reviewed and implemented. Will continue to monitor the patient. Call light has been placed within reach and bed alarm has been activated.    Britt Bolognese RN, BSN

## 2017-02-03 NOTE — Progress Notes (Signed)
   Met with family at bedside.  Introduced Art gallery manager.  Chaplain not needed at this time.  Please page on-call chaplain or contact the spiritual care department, as needed.   Will follow, as needed.   - Rev. Hardeman MDiv ThM

## 2017-02-03 NOTE — ED Provider Notes (Addendum)
[facility]-EMER[facility]ENCY DEPT Provider Note   CSN: [id number] Arrival date & time: 01/20/2017  1137     History   Chief Complaint Chief Complaint  Patient presents with  . Hypotension  . Altered Mental Status    Level V caveat HPI Travis Bradley is a 83 y.o. male. The past medical history of e.coli bacteremia and recent sepsis who is brought in by EMS for altered mental status. When they found patient he had an oxygen saturation in the 70s and they were only able to bring it up to the 80s with 6 L via nasal cannula. Patient was placed on an NRB 15 L with improvement above 90. The patient has been nonverbal since this morning only recently been responding to pain stimuli and found by the staff whom I spoke with, with tachypnea and altered mental status. According to them he has also been refusing to eat, drink or take his medication since yesterday.   HPI  Past Medical History:  Diagnosis Date  . COPD (chronic obstructive pulmonary disease) (HCC)   . Stomach ulcer   . Stomach ulcer     Patient Active Problem List   Diagnosis Date Noted  . Other frontotemporal dementia 12/18/2016  . Sepsis (HCC)   . Bacteremia   . Goals of care, counseling/discussion   . Palliative care encounter   . Atrial fibrillation with RVR (HCC) 12/16/2016  . Fall 12/16/2016  . Dementia 12/16/2016  . Hypotension 12/15/2016  . Dehydration 12/15/2016    Past Surgical History:  Procedure Laterality Date  . HERNIA REPAIR         Home Medications    Prior to Admission medications   Medication Sig Start Date End Date Taking? Authorizing Provider  albuterol (PROAIR HFA) 108 (90 BASE) MCG/ACT inhaler Inhale 2 puffs into the lungs every 6 (six) hours as needed for wheezing.    Historical Provider, MD  apixaban (ELIQUIS) 2.5 MG TABS tablet Take 1 tablet (2.5 mg total) by mouth 2 (two) times daily. 12/20/16   Maryann Mikhail, DO  docusate sodium (COLACE) 100 MG capsule Take 100 mg by mouth 2 (two) times daily.      Historical Provider, MD  HYDROcodone-acetaminophen (NORCO/VICODIN) 5-325 MG tablet Take 1 tablet by mouth every 6 (six) hours as needed for moderate pain. 12/20/16   Maryann Mikhail, DO  mirtazapine (REMERON) 7.5 MG tablet Take 7.5 mg by mouth at bedtime.    Historical Provider, MD  multivitamin-iron-minerals-folic acid (CENTRUM) chewable tablet Chew 1 tablet by mouth daily.    Historical Provider, MD  nitroGLYCERIN (NITROSTAT) 0.4 MG SL tablet Place 0.4 mg under the tongue every 5 (five) minutes as needed for chest pain.    Historical Provider, MD  Nutritional Supplements (NUTRITIONAL DRINK PO) Take by mouth. House 2.0 Med Pass 120 cc by mouth 2 times daily     Historical Provider, MD  NUTRITIONAL SUPPLEMENTS PO House Supplement with meals for caloric support. Treat w/ meals    Historical Provider, MD  Omega-3 Fatty Acids (FISH OIL) 1000 MG CAPS Take 2 capsules by mouth daily. 2000 mg = 2 caps    Historical Provider, MD  omeprazole (PRILOSEC) 10 MG capsule Take 10 mg by mouth daily.     Historical Provider, MD  OXYGEN Inhale 2 L into the lungs as needed. For shortness of breath    Historical Provider, MD  tamsulosin (FLOMAX) 0.4 MG CAPS capsule Take 0.4 mg by mouth daily.     Historical Provider, MD  Family History History reviewed. No pertinent family history.  Social History Social History  Substance Use Topics  . Smoking status: Never Smoker  . Smokeless tobacco: Never Used  . Alcohol use No     Allergies   Nsaids   Review of Systems Review of Systems  Unable to perform ROS: Acuity of condition     Physical Exam Updated Vital Signs BP (!) 80/42 (BP Location: Left Arm)   Pulse (!) 124   Temp (!) 100.5 F (38.1 C) (Rectal)   Resp (!) 23   Ht  (1.651 m)   Wt 48.1 kg   SpO2 93%   BMI 17.64 kg/m   Physical Exam  Constitutional: He appears distressed.  Cachectic elderly male in respiratory distress, the patient appears to be very sick and in potential imminent  threat of death  HENT:  Large hypertrophic scaled lesion with telangiectasia on the left forehead consistent with the appearance of a carcinoma  Eyes:  Pinpoint pupils  Pulmonary/Chest: He is in respiratory distress.  Deep tachypneic breathing with a rate in the 40s Rhonchi heard in the lungs, wheezing in the upper lung fields  Abdominal: Soft.  Neurological: GCS eye subscore is 1. GCS verbal subscore is 1. GCS motor subscore is 5.  Skin: Skin is warm. No cyanosis.  No other lesions or evidence of cellulitis  Nursing note and vitals reviewed.    ED Treatments / Results  Labs (all labs ordered are listed, but only abnormal results are displayed) Labs Reviewed  COMPREHENSIVE METABOLIC PANEL - Abnormal; Notable for the following:       Result Value   Sodium 147 (*)    CO2 17 (*)    Glucose, Bld 109 (*)    BUN 65 (*)    Creatinine, Ser 3.02 (*)    Calcium 8.0 (*)    Albumin 2.1 (*)    AST 109 (*)    GFR calc non Af Amer 16 (*)    GFR calc Af Amer 19 (*)    Anion gap 20 (*)    All other components within normal limits  CBC WITH DIFFERENTIAL/PLATELET - Abnormal; Notable for the following:    WBC 24.7 (*)    RBC 3.24 (*)    Hemoglobin 8.8 (*)    HCT 28.5 (*)    RDW 16.4 (*)    Neutro Abs 22.8 (*)    All other components within normal limits  URINALYSIS, ROUTINE W REFLEX MICROSCOPIC - Abnormal; Notable for the following:    Color, Urine AMBER (*)    APPearance CLOUDY (*)    Hgb urine dipstick LARGE (*)    Protein, ur 30 (*)    Bacteria, UA RARE (*)    Squamous Epithelial / LPF 0-5 (*)    All other components within normal limits  D-DIMER, QUANTITATIVE (NOT AT [facility]) - Abnormal; Notable for the following:    D-Dimer, Quant 7.70 (*)    All other components within normal limits  I-STAT CG4 LACTIC ACID, ED - Abnormal; Notable for the following:    Lactic Acid, Venous 5.61 (*)    All other components within normal limits  I-STAT TROPOININ, ED - Abnormal; Notable for the  following:    Troponin i, poc 0.33 (*)    All other components within normal limits  I-STAT ARTERIAL BLOOD GAS, ED - Abnormal; Notable for the following:    pCO2 arterial 24.2 (*)    pO2, Arterial 52.0 (*)    Bicarbonate 16.3 (*)  Acid-base deficit 7.0 (*)    All other components within normal limits  CULTURE, BLOOD (ROUTINE X 2)  CULTURE, BLOOD (ROUTINE X 2)    EKG  EKG Interpretation None       Radiology Dg Chest Port 1 View  Result Date: 02/09/2017 CLINICAL DATA:  Altered mental status, fever EXAM: PORTABLE CHEST 1 VIEW COMPARISON:  12/15/2016 FINDINGS: There is hyperinflation of the lungs compatible with COPD. Heart is normal size. Coarse interstitial lung markings again noted, stable, likely chronic interstitial lung disease. Patchy opacity in the left perihilar region and left lower lung. No focal opacity on the right. No visible effusions. IMPRESSION: COPD. Interstitial prominence, which may reflect chronic interstitial lung disease. Patchy left perihilar and lower lung airspace opacities concerning for pneumonia. Electronically Signed   By: Charlett Nose M.D.   On: 01/18/2017 12:20    Procedures .Critical Care Performed by: Arthor Captain Authorized by: Arthor Captain   Critical care provider statement:    Critical care time (minutes):  60   Critical care was necessary to treat or prevent imminent or life-threatening deterioration of the following conditions:  Sepsis and shock   Critical care was time spent personally by me on the following activities:  Development of treatment plan with patient or surrogate, discussions with consultants, evaluation of patient's response to treatment, interpretation of cardiac output measurements, obtaining history from patient or surrogate, review of old charts, re-evaluation of patient's condition, pulse oximetry, ordering and review of radiographic studies, ordering and review of laboratory studies and ordering and performing  treatments and interventions    (including critical care time)  Medications Ordered in ED Medications  vancomycin (VANCOCIN) IVPB 1000 mg/200 mL premix (1,000 mg Intravenous New Bag/Given 01/12/2017 1225)  norepinephrine (LEVOPHED) 4 mg in dextrose 5 % 250 mL (0.016 mg/mL) infusion (0 mcg/min Intravenous Hold 02/10/2017 1306)  sodium chloride 0.9 % bolus 500 mL (500 mLs Intravenous New Bag/Given 01/28/2017 1302)  vancomycin (VANCOCIN) 500 mg in sodium chloride 0.9 % 100 mL IVPB (not administered)  piperacillin-tazobactam (ZOSYN) IVPB 2.25 g (not administered)  sodium chloride 0.9 % bolus 500 mL (500 mLs Intravenous New Bag/Given 01/25/2017 1207)  piperacillin-tazobactam (ZOSYN) IVPB 3.375 g (0 g Intravenous Stopped 02/06/2017 1242)  acetaminophen (TYLENOL) suppository 650 mg (650 mg Rectal Given 01/20/2017 1246)  ipratropium-albuterol (DUONEB) 0.5-2.5 (3) MG/3ML nebulizer solution 3 mL (3 mLs Nebulization Given 02/09/2017 1309)     Initial Impression / Assessment and Plan / ED Course  I have reviewed the triage vital signs and the nursing notes.  Pertinent labs & imaging results that were available during my care of the patient were reviewed by me and considered in my medical decision making (see chart for details).  Clinical Course as of Feb 03 1318  Mon Feb 03, 2017  1255 I spoke directly with the patient's daughter he states that he is DO NOT RESUSCITATE, DO NOT INTUBATE but she would like other measures performed.  [AH]  1317 Patient is critically ill Lactic acid is elevated. Patient has gotten the full 30 mL per kilogram bolus of fluids however his blood pressures have trended back down. His lactic acid is elevated, his lungs sound wet and he is not going to tolerate much  more fluid to bring his pressures up. Given this fact I am reluctant to continue as the patient is DNI. We have begun a Levophed drip. Trying to reach out to the patient's daughter again. Patient is being treated for sepsis with  Vancocin  Zosyn. He does have an elevated troponin. Lactic acid is 5.6.  [AH]    Clinical Course User Index [AH] Arthor Captain, PA-C    She is critically ill. His  daughter is here at bedside. Dr. Juanetta Beets I have discussed the level of acuity of the patient. He is currently on Levophed to keep his blood pressures have however given his respiratory status, fluid status, and multiorgan system dysfunction we have had concerns that he though not survive this event. He has had a palliative care consult. The family medicine resident humeral admitting the patient. Patient will be admitted for MRSA shock. He has been given a full 30/kg treatment of fluid. As well as broad spectrum abx.  Final Clinical Impressions(s) / ED Diagnoses   Final diagnoses:  None    New Prescriptions New Prescriptions   No medications on file     Arthor Captain, PA-C Feb 19, 2017 1627    Shaune Pollack, MD 02/05/17 0506    Arthor Captain, PA-C 03/03/17 0004

## 2017-02-03 NOTE — ED Notes (Signed)
Activated Code Sepsis @ 1155. RN aware.

## 2017-02-03 NOTE — Progress Notes (Signed)
ABG was ordered for patient and obtained on 100% non-rebreather.  Results were given to MD.  Will leave patient on non-rebreather at this time.  No further sticks required.  Will continue to monitor.    Ref. Range 01/16/2017 12:27  Sample type Unknown ARTERIAL  pH, Arterial Latest Ref Range: 7.350 - 7.450  7.440  pCO2 arterial Latest Ref Range: 32.0 - 48.0 mmHg 24.2 (L)  pO2, Arterial Latest Ref Range: 83.0 - 108.0 mmHg 52.0 (L)  TCO2 Latest Ref Range: 0 - 100 mmol/L 17  Acid-base deficit Latest Ref Range: 0.0 - 2.0 mmol/L 7.0 (H)  Bicarbonate Latest Ref Range: 20.0 - 28.0 mmol/L 16.3 (L)  O2 Saturation Latest Units: % 87.0  Patient temperature Unknown 100.5 F  Collection site Unknown RADIAL, ALLEN'S T.Marland KitchenMarland Kitchen

## 2017-02-03 NOTE — ED Notes (Signed)
Palliative care at the bedside

## 2017-02-03 NOTE — ED Notes (Signed)
Family at the bedside.

## 2017-02-03 NOTE — ED Notes (Signed)
Paged admitting about patient's bed placement and oxygen

## 2017-02-03 NOTE — Progress Notes (Signed)
Palliative Medicine RN Note: Consult rec'd from ED staff. Daughter Travis Bradley is at bedside. Discussed pt's hx and disease course. She reports that he fell at the beginning of March and never got better. He went to rehab but did not progress, so he moved to a SNF. Pt has a son who had and ABI, and pt was very clear that he never wanted any "life support" for himself. Travis Bradley states that she knows he is at the end of his life and just wants him to be kept comfortable.   Discussed orders with Travis Bradley and with Dr Phillips Odor. Comfort measures initiated. Pt with Cr over 3, so hydromorphone was started instead of morphine. Monitoring stopped. Pt is on a NRB; this can be weaned down when Travis Bradley's breathing is more relaxed. Tele stopped. Travis Bradley understands that death may be imminent and is ok with that.  Family Medicine Residents arrived to admit during my visit; they agree with comfort care plan.  Travis Chance Lipa Knauff, RN, BSN, [facility] Feb 23, 2017 3:21 PM Cell [phone] 8:00-4:00 Monday-Friday Office [phone]

## 2017-02-03 NOTE — ED Notes (Signed)
MD Phillips Odor at the bedside. Requested to take patient off the monitor and off NRB> Pt placed on 2L of oxygen per home normal. Family at the bedside.

## 2017-02-03 NOTE — ED Notes (Addendum)
Xray and RT at the bedside 

## 2017-02-03 NOTE — Progress Notes (Signed)
Pharmacy Antibiotic Note  MONTA POLICE is a 83 y.o. male admitted on 02/06/2017 with sepsis.  Pharmacy has been consulted for vancomycin and zosyn dosing. He is in acute renal failure with sCr 3 and CrCl 10 ml/min.  Vancomycin trough goal 15-20  Plan: 1) Vancomycin 1g IV x 1 then  IV q48 2) Zosyn 3.375g IV x 1 then 2.25g IV q8 3) Follow renal function, cultures, LOT, level if needed  Height:  (165.1 cm) Weight: 106 lb (48.1 kg) IBW/kg (Calculated) : 61.5  Temp (24hrs), Avg:100.5 F (38.1 C), Min:100.5 F (38.1 C), Max:100.5 F (38.1 C)   Recent Labs Lab 01/28/2017 1154 02/02/2017 1200  WBC 24.7*  --   CREATININE 3.02*  --   LATICACIDVEN  --  5.61*    Estimated Creatinine Clearance: 10.2 mL/min (A) (by C-G formula based on SCr of 3.02 mg/dL (H)).    Allergies  Allergen Reactions  . Nsaids     Mild renal insufficiency    Antimicrobials this admission: 4/23 Vancomycin >> 4/23 Zosyn >>  Dose adjustments this admission: n/a  Microbiology results: 4/23 blood x2 >>  Thank you for allowing pharmacy to be a part of this patient's care.  Fredrik Rigger 02/04/2017 12:52 PM

## 2017-02-04 DIAGNOSIS — R6521 Severe sepsis with septic shock: Secondary | ICD-10-CM | POA: Diagnosis not present

## 2017-02-04 DIAGNOSIS — A419 Sepsis, unspecified organism: Secondary | ICD-10-CM | POA: Diagnosis not present

## 2017-02-04 LAB — BLOOD CULTURE ID PANEL (REFLEXED)
Acinetobacter baumannii: NOT DETECTED
CANDIDA ALBICANS: NOT DETECTED
CANDIDA PARAPSILOSIS: NOT DETECTED
CANDIDA TROPICALIS: NOT DETECTED
CARBAPENEM RESISTANCE: NOT DETECTED
Candida glabrata: NOT DETECTED
Candida krusei: NOT DETECTED
Enterobacter cloacae complex: NOT DETECTED
Enterobacteriaceae species: DETECTED — AB
Enterococcus species: NOT DETECTED
Escherichia coli: DETECTED — AB
HAEMOPHILUS INFLUENZAE: NOT DETECTED
Klebsiella oxytoca: NOT DETECTED
Klebsiella pneumoniae: NOT DETECTED
Listeria monocytogenes: NOT DETECTED
NEISSERIA MENINGITIDIS: NOT DETECTED
PROTEUS SPECIES: NOT DETECTED
Pseudomonas aeruginosa: NOT DETECTED
SERRATIA MARCESCENS: NOT DETECTED
STAPHYLOCOCCUS AUREUS BCID: NOT DETECTED
STAPHYLOCOCCUS SPECIES: NOT DETECTED
STREPTOCOCCUS SPECIES: NOT DETECTED
Streptococcus agalactiae: NOT DETECTED
Streptococcus pneumoniae: NOT DETECTED
Streptococcus pyogenes: NOT DETECTED

## 2017-02-04 MED ORDER — IPRATROPIUM-ALBUTEROL 0.5-2.5 (3) MG/3ML IN SOLN: 3.0000 mL | Freq: Four times a day (QID) | RESPIRATORY_TRACT | Status: DC | PRN

## 2017-02-04 MED ORDER — GLYCOPYRROLATE 0.2 MG/ML IJ SOLN
0.4000 mg | Freq: Four times a day (QID) | INTRAMUSCULAR | Status: DC | PRN
  Filled 2017-02-04: qty 2

## 2017-02-04 NOTE — Progress Notes (Signed)
Palliative Medicine RN Note: Daily symptom check. Pt is relaxed, PAINAD 0. Respirations extremely shallow at times, but this is irregularly irregular. Not responsive to me. Removed [place] per order from Dr Phillips Odor, as this can prolong death. He had some increased work of breathing, so I requested RN bring a dose of hydromorphone. Prognosis is very short; per Dr Phillips Odor hours to days, and hospital death is expected. Pt is not stable for transport due to hypotension and respiratory status.  No family is present. Attempted to call daughter Olegario Messier; no answer, left VM. Plan f/u this afternoon by PMT member.  Margret Chance Crews Mccollam, RN, BSN, [facility] 02/04/2017 10:21 AM Cell [phone] 8:00-4:00 Monday-Friday Office [phone]

## 2017-02-04 NOTE — Progress Notes (Signed)
Family Medicine Teaching Service Daily Progress Note Intern Pager: [phone]  [name] Medical record number: [id number] Date of birth: 02/04/1920 Age: 83 y.o. Gender: male  Primary Care Provider: Romie Jumper, PA-C Consultants: Palliative Code Status: DNR  Pt Overview and Major Events to Date:  04/23: Admit for septic shock 2/2 PNA with palliative measures 04/24: BCx positive for Enterobacter and Escherichia coli  Assessment and Plan: CHRITOPHER COSTER is a 83 y.o. male presenting with AMS, tachypnea, hypotension, tachycardia with septic shock from likely PNA . PMH is significant for Dementia, Paroxysmal Atrial Fibrillation, history of sepsis secondary to E. Coli Bacteremia (3/4-9/18), Malnutrition  #Escherichia coli septic shock: CXR concerning for LLL PNA. Tachycardic to 160s to 200s, hypotensive at 60/40s. Family decided on comfort care. Discussed with palliative care.  --comfort care only --Palliative medicine consulted appreciate recommendations: dilaudid 0.5mg  q 1 hr  --zofran PRN --duoneb q6hr --ativan 0.5 q 4 hr PRN  FEN/GI: NPO or patient's preference Prophylaxis: none  Disposition: Continue palliative measures with anticipated hospital death.  Subjective:  Patient unresponsive.  Objective: Temp:  [100.5 F (38.1 C)] 100.5 F (38.1 C) (04/23 1211) Pulse Rate:  [55-166] 60 (04/23 1650) Resp:  [17-36] 26 (04/23 1500) BP: (68-102)/(40-66) 68/40 (04/23 1650) SpO2:  [91 %-100 %] 98 % (04/23 2010) Weight:  [106 lb (48.1 kg)] 106 lb (48.1 kg) (04/23 1152) Physical Exam: General: not awake or alert, in some respiratory distress on non-rebreather Eyes: eyes closed ENTM: unable to examine due to non-rebreather Cardiovascular: tachycardia, regular rhythm Respiratory: on non-rebreather, coarse breath sounds bilaterally  Gastrointestinal: soft, NT, ND, + BS Neuro: GCS 7 (eye 1, verbal 1, motor 5)   Laboratory:  Recent Labs Lab 01/20/2017 1154  WBC  24.7*  HGB 8.8*  HCT 28.5*  PLT 194    Recent Labs Lab 01/12/2017 1154  NA 147*  K 3.8  CL 110  CO2 17*  BUN 65*  CREATININE 3.02*  CALCIUM 8.0*  PROT 6.6  BILITOT 1.1  ALKPHOS 70  ALT 32  AST 109*  GLUCOSE 109*   Lactic Acid: 5.61 D-dimer: 7.7 ABG: ph 7.44, o2 52, co2 24  Imaging/Diagnostic Tests: DG Chest Port 1 View (01/26/2017) FINDINGS: There is hyperinflation of the lungs compatible with COPD. Heart is normal size. Coarse interstitial lung markings again noted, stable, likely chronic interstitial lung disease. Patchy opacity in the left perihilar region and left lower lung. No focal opacity on the right. No visible effusions.  IMPRESSION: COPD. Interstitial prominence, which may reflect chronic interstitial lung disease. Patchy left perihilar and lower lung airspace opacities concerning for pneumonia.  Wendee Beavers, DO 02/04/2017, 8:14 AM PGY-1, [facility] Family Medicine FPTS Intern pager: [phone], text pages welcome

## 2017-02-04 NOTE — Progress Notes (Signed)
Nutrition Brief Note  Pt triggered for assessment per Malnutrition Screening Tool. Chart reviewed. Pt now transitioning to comfort care.  No further nutrition interventions warranted at this time.  Please re-consult as needed.   Romelle Starcher MS, RD, LDN [phone] Pager  [phone] Weekend/On-Call Pager

## 2017-02-04 NOTE — Progress Notes (Signed)
   02/04/17 0900  Clinical Encounter Type  Visited With Patient  Visit Type Initial;Spiritual support  Referral From Nurse;Palliative care team  Consult/Referral To Chaplain  Spiritual Encounters  Spiritual Needs Emotional  Stress Factors  Patient Stress Factors Health changes    Chaplain responded to [place] consult from palliative team for comfort care. I spent approximately 20 minutes with patient, tv was on and patient seemed to be watching intently. However, he did not seem to engage me while I was in the room. Patient seemed to be in a far away place, mumbled a bit when I asked questions. I offered ministry of presence and emotional support, no family at bedside at this time. Moe Brier L. Salomon Fick, MDiv

## 2017-02-04 NOTE — Progress Notes (Signed)
PHARMACY - PHYSICIAN COMMUNICATION CRITICAL VALUE ALERT - BLOOD CULTURE IDENTIFICATION (BCID)  Results for orders placed or performed during the hospital encounter of 12/15/16  Blood Culture ID Panel (Reflexed) (Collected: 12/15/2016  6:05 PM)  Result Value Ref Range   Enterococcus species NOT DETECTED NOT DETECTED   Listeria monocytogenes NOT DETECTED NOT DETECTED   Staphylococcus species NOT DETECTED NOT DETECTED   Staphylococcus aureus NOT DETECTED NOT DETECTED   Streptococcus species NOT DETECTED NOT DETECTED   Streptococcus agalactiae NOT DETECTED NOT DETECTED   Streptococcus pneumoniae NOT DETECTED NOT DETECTED   Streptococcus pyogenes NOT DETECTED NOT DETECTED   Acinetobacter baumannii NOT DETECTED NOT DETECTED   Enterobacteriaceae species DETECTED (A) NOT DETECTED   Enterobacter cloacae complex NOT DETECTED NOT DETECTED   Escherichia coli DETECTED (A) NOT DETECTED   Klebsiella oxytoca NOT DETECTED NOT DETECTED   Klebsiella pneumoniae NOT DETECTED NOT DETECTED   Proteus species NOT DETECTED NOT DETECTED   Serratia marcescens NOT DETECTED NOT DETECTED   Carbapenem resistance NOT DETECTED NOT DETECTED   Haemophilus influenzae NOT DETECTED NOT DETECTED   Neisseria meningitidis NOT DETECTED NOT DETECTED   Pseudomonas aeruginosa NOT DETECTED NOT DETECTED   Candida albicans NOT DETECTED NOT DETECTED   Candida glabrata NOT DETECTED NOT DETECTED   Candida krusei NOT DETECTED NOT DETECTED   Candida parapsilosis NOT DETECTED NOT DETECTED   Candida tropicalis NOT DETECTED NOT DETECTED    Name of physician (or Provider) Contacted: FMTS   Changes to prescribed antibiotics required: None currently, pt on comfort care  Abran Duke 02/04/2017  7:00 AM

## 2017-02-04 NOTE — Progress Notes (Signed)
Palliative Medicine RN Note: Called and spoke to [employer] for update. She reports no changes and no family present now. Travis Bradley did ask about transfer to inpt hospice (she had the question from [facility]). Based on my assessment this morning and my discussion of pt with Dr Phillips Odor, PMT cannot recommend transfer today, and attending notes states they anticipate hospital death as well. PMT member will re-assess in the morning to see if Travis Bradley has stabilized enough to move tomorrow.  Margret Chance Tonea Leiphart, RN, BSN, [facility] 02/04/2017 2:27 PM Cell [phone] 8:00-4:00 Monday-Friday Office [phone]

## 2017-02-05 DIAGNOSIS — A4151 Sepsis due to Escherichia coli [E. coli]: Secondary | ICD-10-CM | POA: Diagnosis present

## 2017-02-05 DIAGNOSIS — A419 Sepsis, unspecified organism: Secondary | ICD-10-CM | POA: Diagnosis not present

## 2017-02-05 DIAGNOSIS — N179 Acute kidney failure, unspecified: Secondary | ICD-10-CM | POA: Diagnosis present

## 2017-02-05 DIAGNOSIS — R402212 Coma scale, best verbal response, none, at arrival to emergency department: Secondary | ICD-10-CM | POA: Diagnosis present

## 2017-02-05 DIAGNOSIS — R402112 Coma scale, eyes open, never, at arrival to emergency department: Secondary | ICD-10-CM | POA: Diagnosis present

## 2017-02-05 DIAGNOSIS — Z8711 Personal history of peptic ulcer disease: Secondary | ICD-10-CM | POA: Diagnosis not present

## 2017-02-05 DIAGNOSIS — Z66 Do not resuscitate: Secondary | ICD-10-CM | POA: Diagnosis present

## 2017-02-05 DIAGNOSIS — Z9981 Dependence on supplemental oxygen: Secondary | ICD-10-CM | POA: Diagnosis not present

## 2017-02-05 DIAGNOSIS — Z515 Encounter for palliative care: Secondary | ICD-10-CM | POA: Diagnosis present

## 2017-02-05 DIAGNOSIS — I480 Paroxysmal atrial fibrillation: Secondary | ICD-10-CM | POA: Diagnosis present

## 2017-02-05 DIAGNOSIS — I959 Hypotension, unspecified: Secondary | ICD-10-CM | POA: Diagnosis present

## 2017-02-05 DIAGNOSIS — R6521 Severe sepsis with septic shock: Secondary | ICD-10-CM | POA: Diagnosis present

## 2017-02-05 DIAGNOSIS — J189 Pneumonia, unspecified organism: Secondary | ICD-10-CM | POA: Diagnosis present

## 2017-02-05 DIAGNOSIS — Z79899 Other long term (current) drug therapy: Secondary | ICD-10-CM | POA: Diagnosis not present

## 2017-02-05 DIAGNOSIS — Z7901 Long term (current) use of anticoagulants: Secondary | ICD-10-CM | POA: Diagnosis not present

## 2017-02-05 DIAGNOSIS — E872 Acidosis: Secondary | ICD-10-CM | POA: Diagnosis present

## 2017-02-05 DIAGNOSIS — R0603 Acute respiratory distress: Secondary | ICD-10-CM | POA: Diagnosis present

## 2017-02-05 DIAGNOSIS — R64 Cachexia: Secondary | ICD-10-CM | POA: Diagnosis present

## 2017-02-05 DIAGNOSIS — R402352 Coma scale, best motor response, localizes pain, at arrival to emergency department: Secondary | ICD-10-CM | POA: Diagnosis present

## 2017-02-05 DIAGNOSIS — J440 Chronic obstructive pulmonary disease with acute lower respiratory infection: Secondary | ICD-10-CM | POA: Diagnosis present

## 2017-02-05 DIAGNOSIS — Z681 Body mass index (BMI) 19 or less, adult: Secondary | ICD-10-CM | POA: Diagnosis not present

## 2017-02-05 DIAGNOSIS — G3109 Other frontotemporal dementia: Secondary | ICD-10-CM | POA: Diagnosis present

## 2017-02-05 MED ORDER — HYDROMORPHONE HCL 1 MG/ML IJ SOLN
0.5000 mg | INTRAMUSCULAR | Status: DC
  Administered 2017-02-05 – 2017-02-07 (×15): 0.5 mg via INTRAVENOUS
  Filled 2017-02-05 (×16): qty 1

## 2017-02-05 NOTE — Progress Notes (Signed)
Family Medicine Teaching Service Daily Progress Note Intern Pager: [phone]  [name] Medical record number: [id number] Date of birth: 01/25/1920 Age: 83 y.o. Gender: male  Primary Care Provider: Romie Jumper, PA-C Consultants: Palliative Code Status: DNR  Pt Overview and Major Events to Date:  04/23: Admit for septic shock 2/2 PNA with palliative measures 04/24: BCx positive for Enterobacter and Escherichia coli  Assessment and Plan: Travis Bradley is a 83 y.o. male presenting with AMS, tachypnea, hypotension, tachycardia with septic shock from likely PNA . PMH is significant for Dementia, Paroxysmal Atrial Fibrillation, history of sepsis secondary to E. Coli Bacteremia (3/4-9/18), Malnutrition  Escherichia coli septic shock: CXR concerning for LLL PNA. Continues to be severely hypotensive, satting 83% on RA. Family decided on comfort care. Palliative care following. - comfort care only - Palliative medicine consulted appreciate recommendations: dilaudid 0.5mg  q1H - zofran PRN - duoneb q6hr - ativan 0.5 q2H PRN - patient not stable for transfer, anticipated hospital death - SW consulted for [facility]  FEN/GI: NPO or patient's preference Prophylaxis: none  Disposition: Continue palliative measures with anticipated hospital death.  Subjective:  Patient opens eyes when I am in the room but unresponsive. Does not answer questions or respond to commands  Objective: Temp:  [99.7 F (37.6 C)] 99.7 F (37.6 C) (04/24 1057) Pulse Rate:  [124-178] 124 (04/24 1437) Resp:  [28-30] 30 (04/24 1437) BP: (74-121)/(32-39) 74/32 (04/24 1437) SpO2:  [83 %-87 %] 83 % (04/24 1437) Physical Exam: General: awake but not very alert, stares into space, on room air Cardiovascular: RRR, no m/r/g Respiratory: distant breath sounds Gastrointestinal: soft, NT, ND  Laboratory:  Recent Labs Lab 01/20/2017 1154  WBC 24.7*  HGB 8.8*  HCT 28.5*  PLT 194     Recent Labs Lab 01/13/2017 1154  NA 147*  K 3.8  CL 110  CO2 17*  BUN 65*  CREATININE 3.02*  CALCIUM 8.0*  PROT 6.6  BILITOT 1.1  ALKPHOS 70  ALT 32  AST 109*  GLUCOSE 109*   Lactic Acid: 5.61 D-dimer: 7.7 ABG: ph 7.44, o2 52, co2 24  Imaging/Diagnostic Tests: DG Chest Port 1 View (01/23/2017) FINDINGS: There is hyperinflation of the lungs compatible with COPD. Heart is normal size. Coarse interstitial lung markings again noted, stable, likely chronic interstitial lung disease. Patchy opacity in the left perihilar region and left lower lung. No focal opacity on the right. No visible effusions.  IMPRESSION: COPD. Interstitial prominence, which may reflect chronic interstitial lung disease. Patchy left perihilar and lower lung airspace opacities concerning for pneumonia.  Howard Pouch, MD 02/05/2017, 9:31 AM PGY-1, [facility] Family Medicine FPTS Intern pager: [phone], text pages welcome

## 2017-02-05 NOTE — Progress Notes (Signed)
Palliative Medicine RN Note: Afternoon symptom check. Pt is resting with no grimacing or labored breathing. Respirations are very, very shallow. Discussed pt with daughter Lynden Ang, who is at bedside. She has to go back to work tomorrow. Also discussed pt with Dr Phillips Odor.  PMT recommends not moving the pt to inpt hospice at this time. His fragile respiratory status puts him at high risk for ambulance transport. His daughter and I discussed his death; all living family members and close friends have been to visit, and she has given him permission to die. I also told the pt that Lynden Ang would make sure her brother is cared for (he is in a NF with TBI) and that she is ok with him letting go.  Daughter reports that the pt has hated being turned for a while before admission d/t pain; placed order at her request that pt may be allowed to remain in a comfortable position. Plan f/u by PMT member in the morning if pt is still alive.  Margret Chance Ritik Stavola, RN, BSN, [facility] 02/05/2017 3:28 PM Cell [phone] 8:00-4:00 Monday-Friday Office [phone]

## 2017-02-05 NOTE — Progress Notes (Signed)
Pt resting, respirations 24 even and unlabored

## 2017-02-05 NOTE — Progress Notes (Signed)
Left msg with Shirlean Mylar for pts daughter Loney Laurence in room and would like to speak with her.

## 2017-02-05 NOTE — Progress Notes (Signed)
Palliative Medicine RN Note: Morning symptom check. Pt is grimacing, not consolable, fidgeting with blankets and chin. Has not gotten Dilaudid since yesterday afternoon. Dr Phillips Odor ordered scheduled Dilaudid to ensure continued comfort. Respirations are very shallow. Rate documented this am was 30, but is currently 38/minute during my assessment. He is hot to the touch.   Attending has put in order for GIP. PMT no longer acts as admitting for GIP; updated attending of this change.   BP this am was 74/32 with a room air sat 83%. PMT does not feel he is stable for transport to a hospice facility, but should pt be admitted to [facility], the decision would rest with the hospice provider.   Will discuss with RN Inetta Fermo.  Margret Chance Cortlandt Capuano, RN, BSN, [facility] 02/05/2017 9:36 AM Cell [phone] 8:00-4:00 Monday-Friday Office [phone]

## 2017-02-06 LAB — CULTURE, BLOOD (ROUTINE X 2): Special Requests: ADEQUATE

## 2017-02-06 NOTE — Progress Notes (Signed)
Palliative Medicine RN Note: Afternoon symptom check. No change since this morning. Pt with very shallow respirations. PMT will follow up in the morning.  Margret Chance Breydon Senters, RN, BSN, [facility] 02/06/2017 3:37 PM Cell [phone] 8:00-4:00 Monday-Friday Office [phone]

## 2017-02-06 NOTE — Progress Notes (Signed)
Family Medicine Teaching Service Daily Progress Note Intern Pager: [phone]  [name] Medical record number: [id number] Date of birth: June 14, 1920 Age: 83 y.o. Gender: male  Primary Care Provider: Romie Jumper, PA-C Consultants: Palliative Code Status: DNR  Pt Overview and Major Events to Date:  04/23: Admit for septic shock 2/2 PNA with palliative measures 04/24: BCx positive for Enterobacter and Escherichia coli  Assessment and Plan: Travis Bradley is a 83 y.o. male presenting with AMS, tachypnea, hypotension, tachycardia with septic shock from likely PNA . PMH is significant for Dementia, Paroxysmal Atrial Fibrillation, history of sepsis secondary to E. Coli Bacteremia (3/4-9/18), Malnutrition  Escherichia coli septic shock: CXR concerning for LLL PNA. Continues to be severely hypotensive, satting 78% on RA. Family decided on comfort care. Palliative care following. - comfort care only - Palliative medicine consulted appreciate recommendations: dilaudid 0.5mg  q1H - zofran PRN - duoneb q6hr - ativan 0.5 q2H PRN - patient not stable for transfer, anticipated hospital death - SW consulted for eval for [facility] - per PMT patient not considered stable for transport to hospice  FEN/GI: NPO or patient's preference Prophylaxis: none  Disposition: Continue palliative measures with anticipated hospital death.  Subjective:  Patient unresponsive, sleeping in bed. No Grimacing or signs of discomfort  Objective: SpO2:  [78 %] 78 % (04/25 1000) Physical Exam: General: sleeping, NAD, on room air Cardiovascular: RRR, no m/r/g Respiratory: distant breath sounds Gastrointestinal: soft, NT, ND, pulses felt in abdomen  Laboratory:  Recent Labs Lab 02/04/2017 1154  WBC 24.7*  HGB 8.8*  HCT 28.5*  PLT 194    Recent Labs Lab 02/06/2017 1154  NA 147*  K 3.8  CL 110  CO2 17*  BUN 65*  CREATININE 3.02*  CALCIUM 8.0*  PROT 6.6  BILITOT 1.1  ALKPHOS 70   ALT 32  AST 109*  GLUCOSE 109*   Lactic Acid: 5.61 D-dimer: 7.7 ABG: ph 7.44, o2 52, co2 24  Imaging/Diagnostic Tests: DG Chest Port 1 View (01/14/2017) FINDINGS: There is hyperinflation of the lungs compatible with COPD. Heart is normal size. Coarse interstitial lung markings again noted, stable, likely chronic interstitial lung disease. Patchy opacity in the left perihilar region and left lower lung. No focal opacity on the right. No visible effusions.  IMPRESSION: COPD. Interstitial prominence, which may reflect chronic interstitial lung disease. Patchy left perihilar and lower lung airspace opacities concerning for pneumonia.  Howard Pouch, MD 02/06/2017, 9:16 AM PGY-1, [facility] Family Medicine FPTS Intern pager: [phone], text pages welcome

## 2017-02-06 NOTE — Progress Notes (Addendum)
Palliative Medicine RN Note: AM symptom check. Discussed pt with Dr Neale Burly with PMT. Respirations are very shallow with periods of apnea lasting up to 10 seconds during my visit. He is non responsive and pale. More temporal wasting today. PMT feels he is unstable for transfer to hospice facility, as he is at very high risk for death in the ambulance.   Plan for PMT follow up this afternoon. Per daughter's request, texted her update with no identifiable information (no patient name, room number, dx). "This is Oncologist at [facility]. He's very, very comfortable. I talked to one of my team MDs, and we are still recommending he stay here. I'll update you again this afternoon."  Sequoya Hogsett G. Tanelle Lanzo, RN, BSN, [facility] 02/06/2017 10:18 AM Cell [phone] 8:00-4:00 Monday-Friday Office [phone]

## 2017-02-07 DIAGNOSIS — Z515 Encounter for palliative care: Secondary | ICD-10-CM

## 2017-02-08 LAB — CULTURE, BLOOD (ROUTINE X 2): SPECIAL REQUESTS: ADEQUATE

## 2017-02-11 NOTE — Progress Notes (Signed)
Pt passed away at 2150. This nurse and Barron Alvine. Both pronounced pt. Daughter, Lynden Ang was notified of passing. Daughter did not want to come and see pt at this time.   Larey Days, RN

## 2017-02-11 NOTE — Discharge Summary (Signed)
Family Medicine Teaching [facility] Discharge Summary  [name] Medical record number: [id number] Date of birth: Sep 17, 1920 Age: 83 y.o. Gender: male Date of Admission: 01/21/2017  Date of Discharge: 4/27 Admitting Physician: Carney Living, MD  Primary Care Provider: Romie Jumper, PA-C Consultants: Palliative  Indication for Hospitalization: Severe sepsis, hospice care  Discharge Diagnoses/Problem List:  Patient Active Problem List   Diagnosis Date Noted  . Hospice care   . Septic shock (HCC)   . Other frontotemporal dementia 12/18/2016  . Sepsis (HCC)   . Bacteremia   . Goals of care, counseling/discussion   . Palliative care encounter   . Atrial fibrillation with RVR (HCC) 12/16/2016  . Fall 12/16/2016  . Dementia 12/16/2016  . Hypotension December 18, 2016  . Dehydration 12/18/2016    Disposition: Deceased patient  Brief Hospital Course:  [name] a 83 y.o.malepresenting with AMS, tachypnea, hypotension, tachycardia with septic shock from likely PNA. PMH is significant for Dementia, Paroxysmal Atrial Fibrillation, history of sepsis secondary to E. Coli Bacteremia (3/4-9/18), Malnutrition  He was admitted with severe sepsis. He was hypotensive and taken off of pressors in ED when family rapidly decided on comfort care during a discussion with palliative care. Patient received comfort measures only in the hospital including zofran, ativan, dilaudid, duonebs. He was not stable for discharge to hospice and so remained hospitalized as he declined and eventually passed away.   Howard Pouch, MD 02/10/2017, 11:50 AM PGY-1, [facility] Family Medicine

## 2017-02-11 NOTE — Progress Notes (Signed)
Palliative Medicine RN Note: Pt has had significant change in respiratory status since yesterday. Resp very shallow with rate around 8-10 at the time of my assessment.  Spoke with RN (she had him yesterday also) who agreed that pt has had major change. Updated his daughter. Requested RN call me if there are more changes. PMT will f/u this afternoon.  Margret Chance Travis Debois, RN, BSN, [facility] 27-Feb-2017 10:09 AM Cell [phone] 8:00-4:00 Monday-Friday Office [phone]

## 2017-02-11 NOTE — Progress Notes (Signed)
Family Medicine Teaching Service Daily Progress Note Intern Pager: [phone]  [name] Medical record number: [id number] Date of birth: 09/07/1920 Age: 83 y.o. Gender: male  Primary Care Provider: Romie Jumper, PA-C Consultants: Palliative Code Status: DNR  Pt Overview and Major Events to Date:  04/23: Admit for septic shock 2/2 PNA with palliative measures 04/24: BCx positive for Enterobacter and Escherichia coli  Assessment and Plan: Travis Bradley is a 83 y.o. male presenting with AMS, tachypnea, hypotension, tachycardia with septic shock from likely PNA . PMH is significant for Dementia, Paroxysmal Atrial Fibrillation, history of sepsis secondary to E. Coli Bacteremia (3/4-9/18), Malnutrition  Escherichia coli septic shock: CXR concerning for LLL PNA. Family decided on comfort care. Palliative care following. No vitals documented yet today. Shallow breathing, no grimacing this AM. - comfort care only - Palliative medicine consulted appreciate recommendations: dilaudid 0.5mg  q1H - zofran PRN - duoneb q6hr - ativan 0.5 q2H PRN - patient not stable for transfer, anticipated hospital death - SW consulted for eval for [facility] - per [facility]T patient not considered stable for transport to hospice  FEN/GI: NPO or patient's preference Prophylaxis: none  Disposition: Continue palliative measures with anticipated hospital death.  Subjective:  Patient with no acute events overnight. Remains on full comfort care. Taking quick shallow breaths this AM. Remains unresponsive.  Objective:   Physical Exam: General: sleeping, NAD, on room air Cardiovascular: RRR, no m/r/g  Respiratory: distant breath sounds, quick shallow breathing Gastrointestinal: soft, NT, ND, bounding pulses felt in abdomen  Laboratory:  Recent Labs Lab 01/17/2017 1154  WBC 24.7*  HGB 8.8*  HCT 28.5*  PLT 194    Recent Labs Lab 01/25/2017 1154  NA 147*  K 3.8  CL 110  CO2 17*  BUN  65*  CREATININE 3.02*  CALCIUM 8.0*  PROT 6.6  BILITOT 1.1  ALKPHOS 70  ALT 32  AST 109*  GLUCOSE 109*   Lactic Acid: 5.61 D-dimer: 7.7 ABG: ph 7.44, o2 52, co2 24  Imaging/Diagnostic Tests: DG Chest Port 1 View (01/12/2017) FINDINGS: There is hyperinflation of the lungs compatible with COPD. Heart is normal size. Coarse interstitial lung markings again noted, stable, likely chronic interstitial lung disease. Patchy opacity in the left perihilar region and left lower lung. No focal opacity on the right. No visible effusions.  IMPRESSION: COPD. Interstitial prominence, which may reflect chronic interstitial lung disease. Patchy left perihilar and lower lung airspace opacities concerning for pneumonia.  Travis Pouch, MD 03-05-2017, 9:46 AM PGY-1, [facility] Family Medicine FPTS Intern pager: [phone], text pages welcome

## 2017-02-11 NOTE — Progress Notes (Signed)
Palliative Medicine RN Note: Afternoon symptom check. Pt has not changed since this am. Updated daughter Lynden Ang. PMT member will check on pt over the weekend.  Margret Chance Baruch Lewers, RN, BSN, [facility] 2017/03/08 2:25 PM Cell [phone] 8:00-4:00 Monday-Friday Office [phone]

## 2017-02-11 DEATH — deceased

## 2017-02-23 ENCOUNTER — Encounter: Payer: Self-pay | Admitting: Internal Medicine

## 2018-01-07 IMAGING — DX DG CHEST 1V PORT
1 series · 1 of 1 positions shown · non-contrast
Comparison: None.

CLINICAL DATA: Possible fall, left hip pain.

EXAM:
PORTABLE CHEST 1 VIEW

[chest ap]
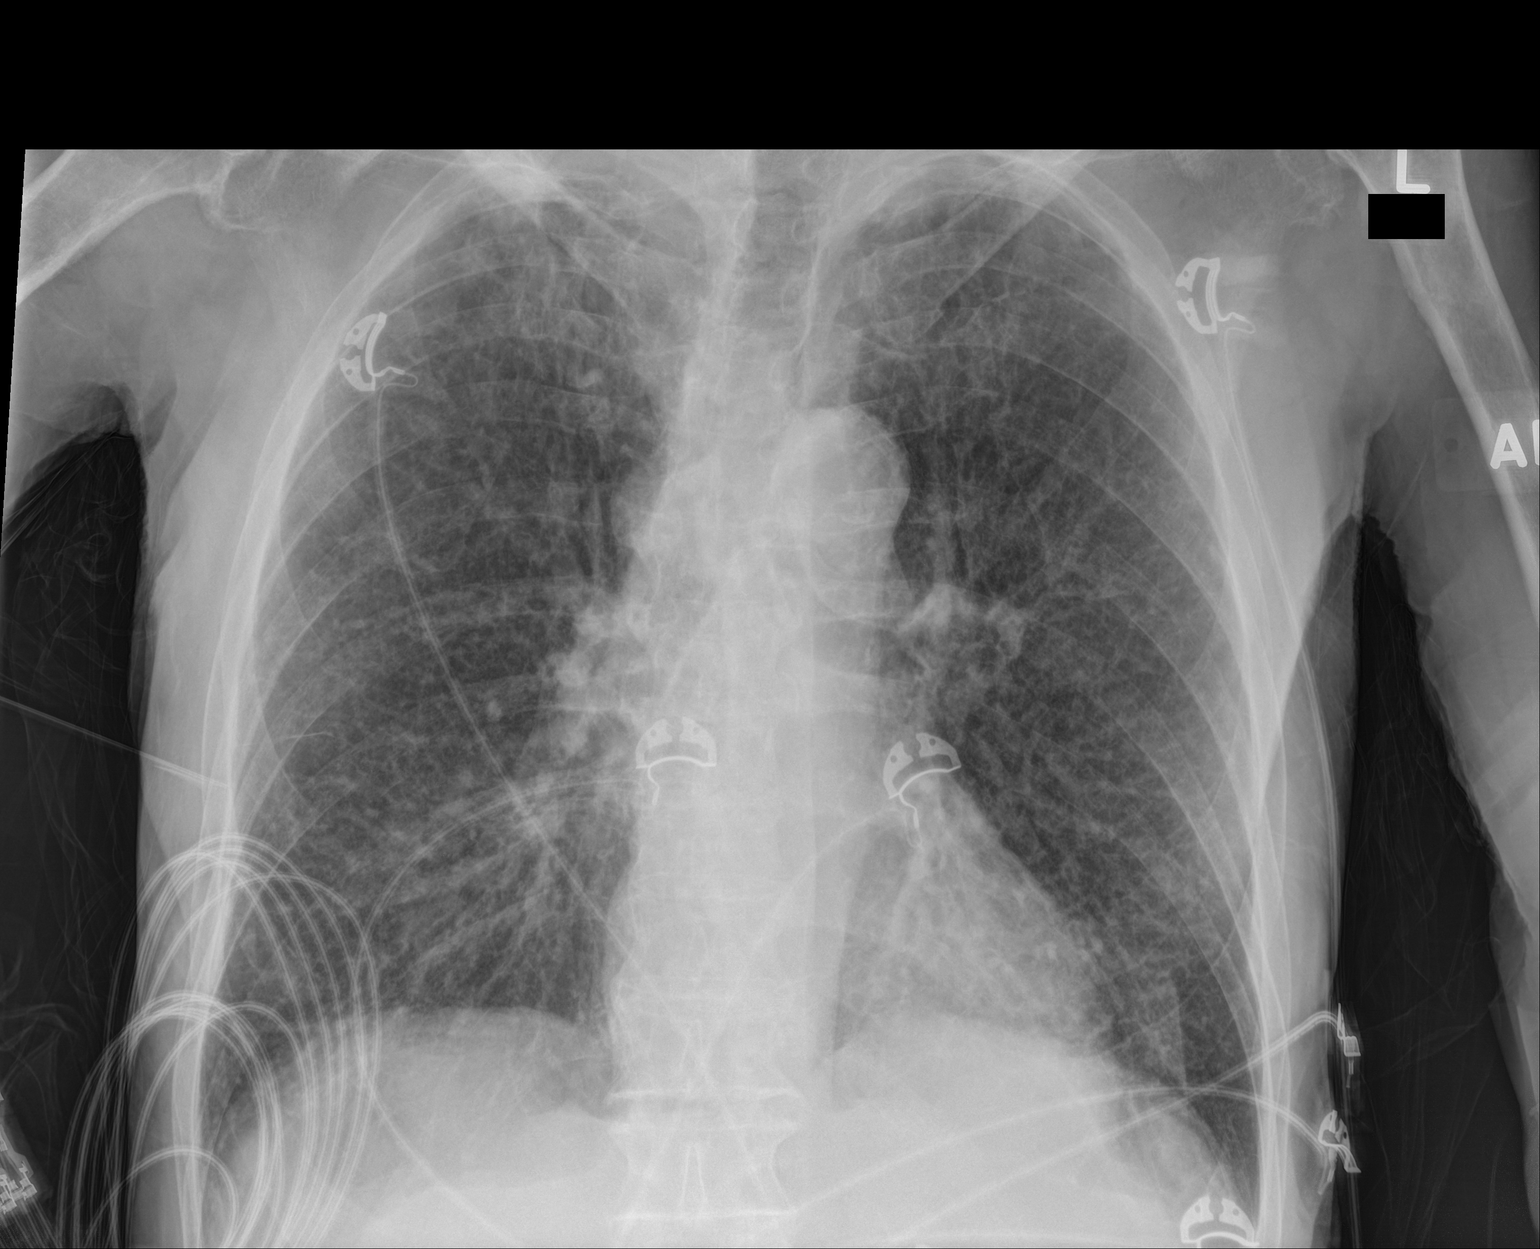

[1 of 1 positions shown; findings below may reference images not displayed]

FINDINGS: Heart size and mediastinal contours are within normal limits.
Atherosclerotic changes noted at the aortic arch.

Coarse interstitial markings are seen throughout both lungs, of
uncertain chronicity, interstitial edema versus chronic interstitial
lung disease. Suspect chronic bronchitic changes centrally. No
confluent airspace opacity. No pleural effusion or pneumothorax
seen. No osseous fracture or dislocation seen.
IMPRESSION: 1. Coarse interstitial lung markings throughout both lungs, of
uncertain chronicity, interstitial edema versus chronic interstitial
lung disease.
2. Suspect chronic bronchitic changes.
3. No evidence of consolidating pneumonia.
4. Aortic atherosclerosis.

## 2018-01-11 IMAGING — RF DG SWALLOWING FUNCTION - NRPT [facility]
1 series · 18 of 24 positions shown · non-contrast
Comparison: none

[Series 1: run · 27 acquisitions, 18 frames shown]
[im 1/27]
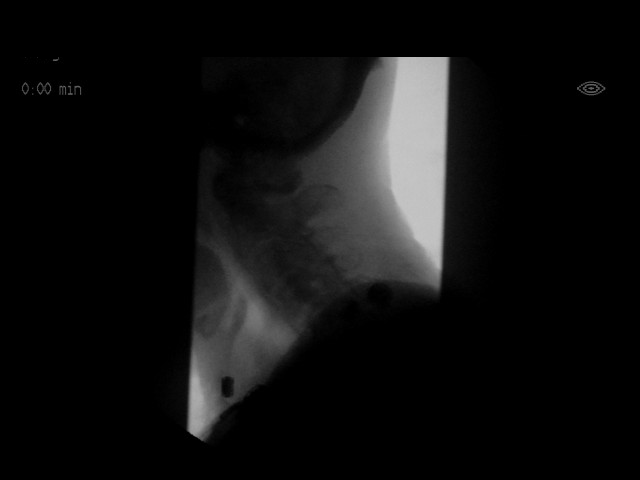
[im 3/27]
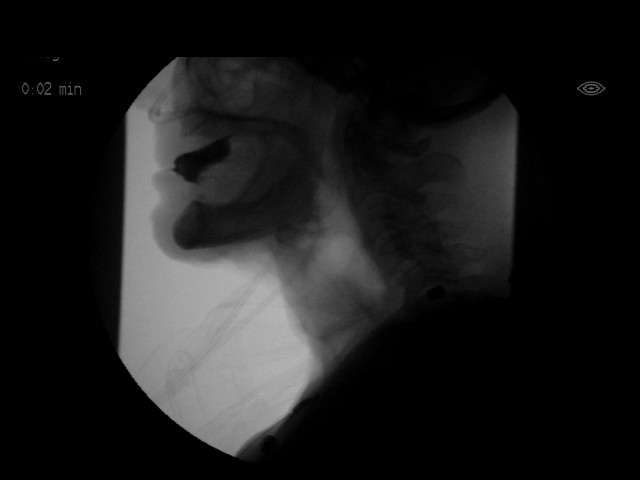
[im 4/27]
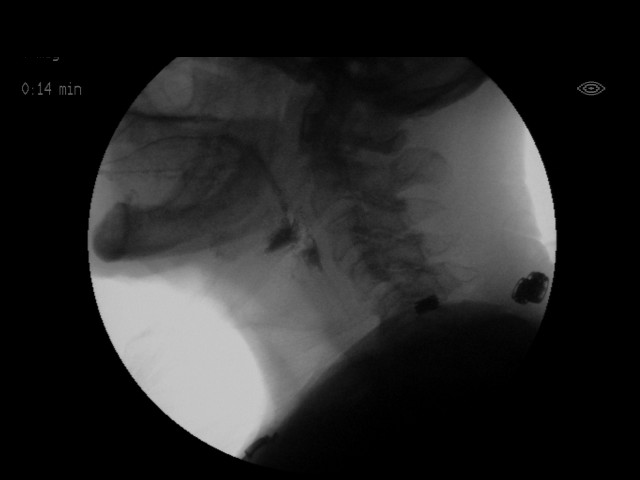
[im 5/27]
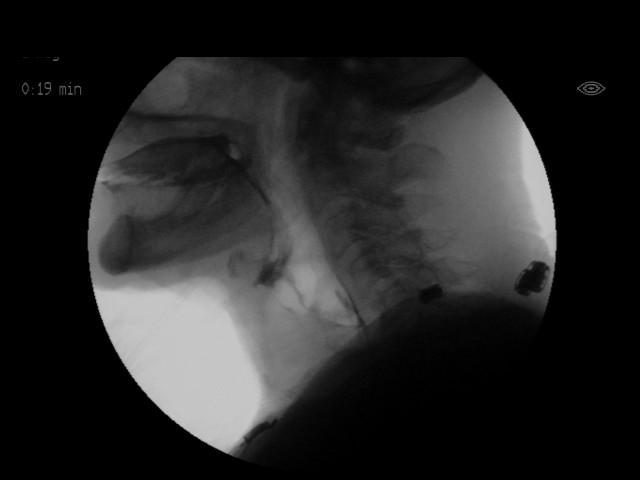
[im 7/27]
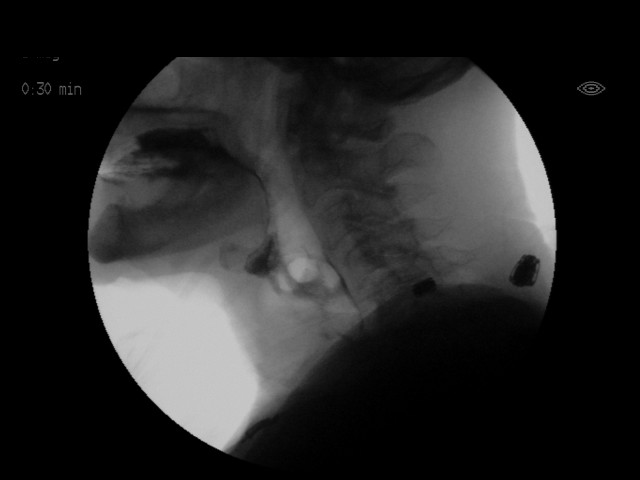
[im 8/27]
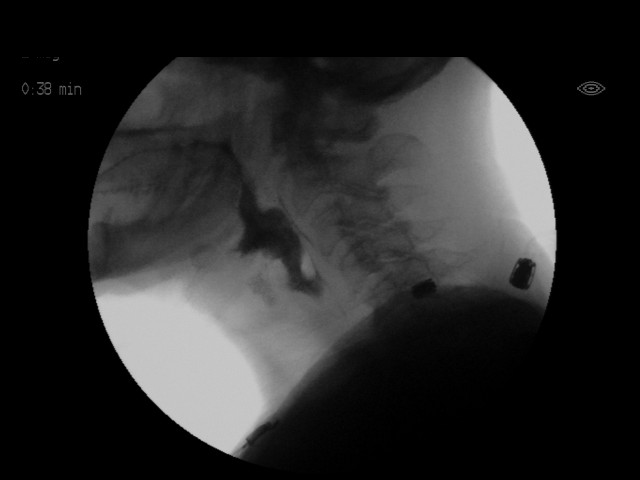
[im 10/27]
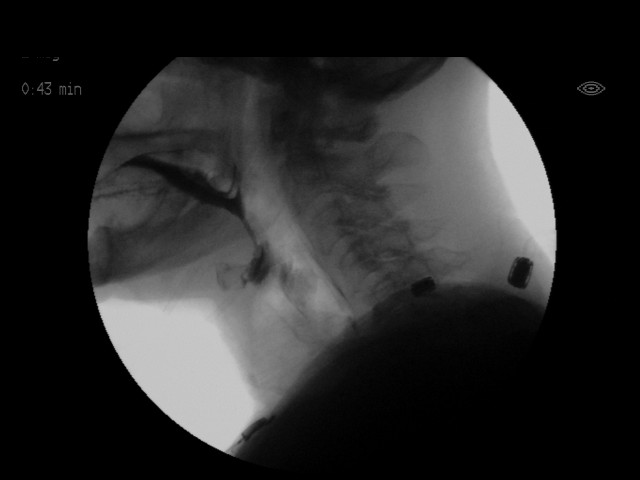
[im 12/27]
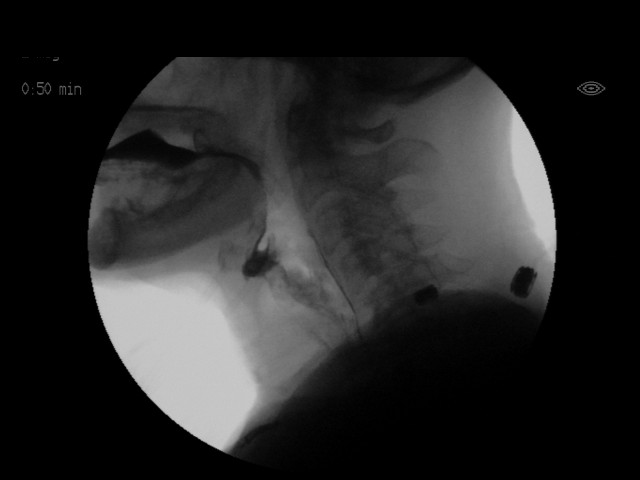
[im 13/27]
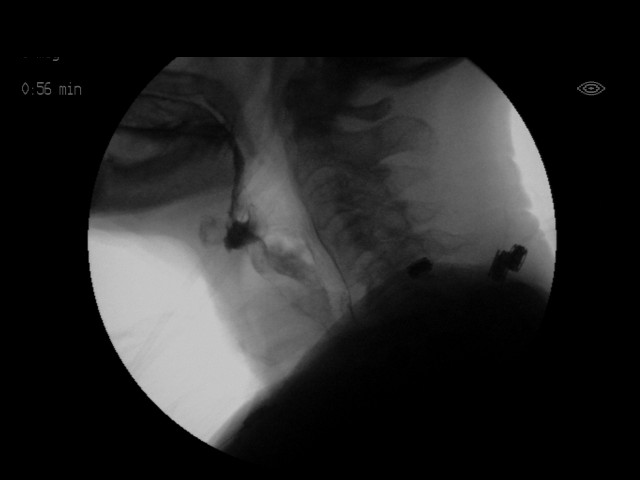
[im 14/27]
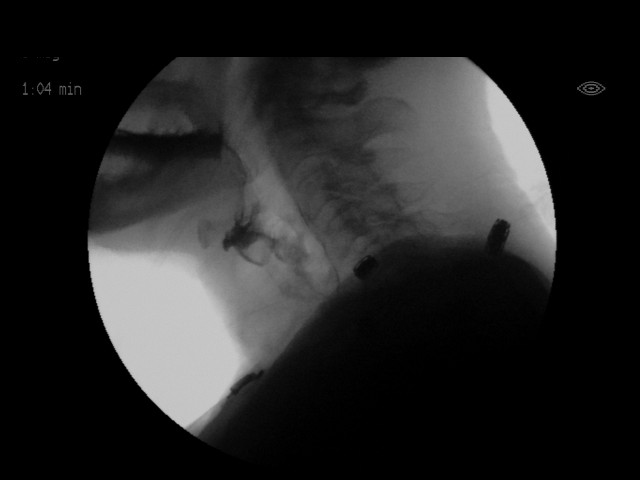
[im 16/27]
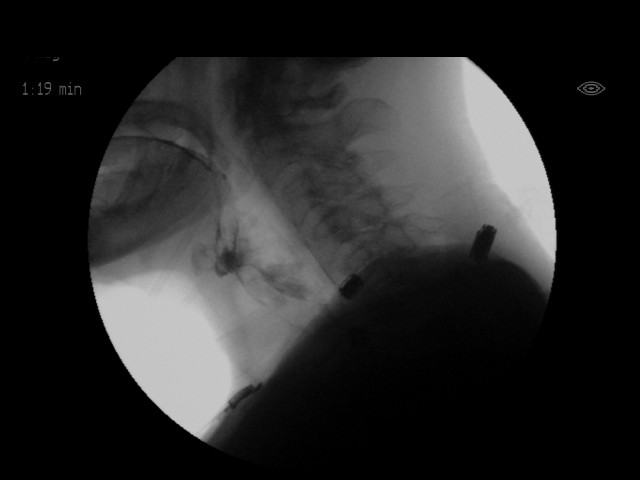
[im 17/27]
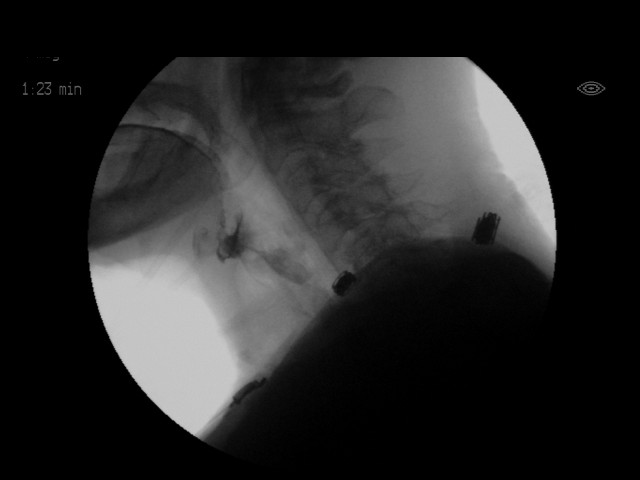
[im 19/27]
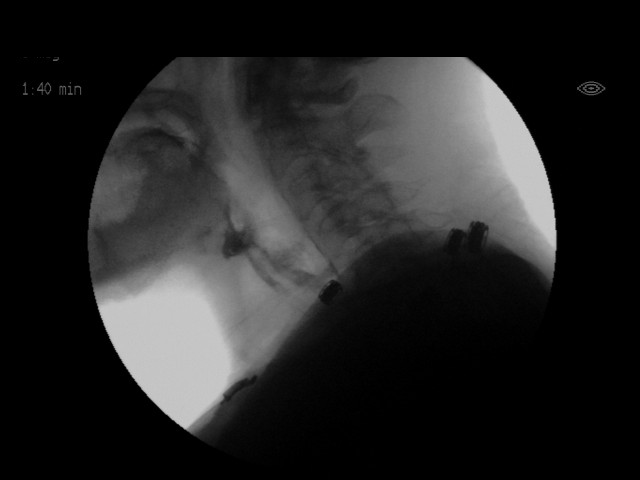
[im 21/27]
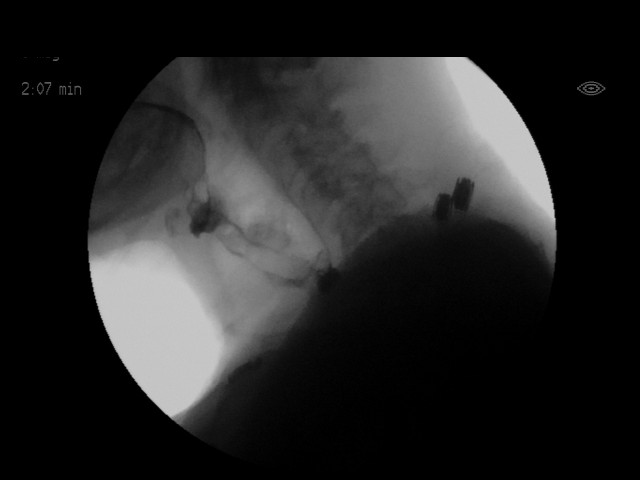
[im 22/27]
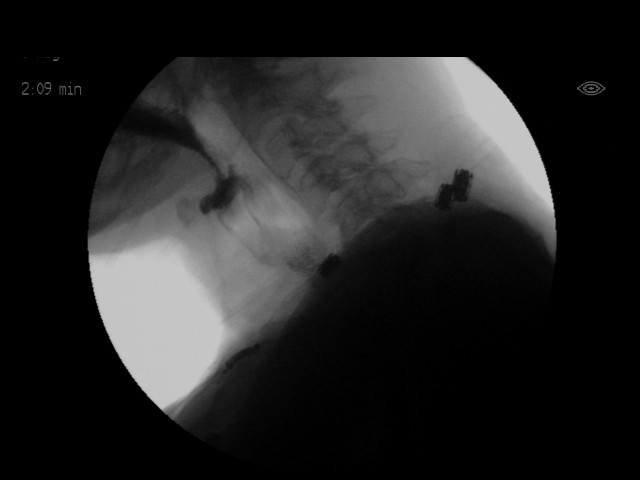
[im 23/27]
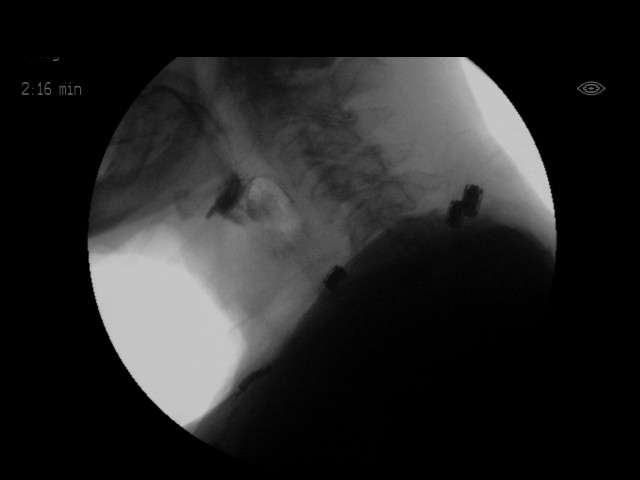
[im 25/27]
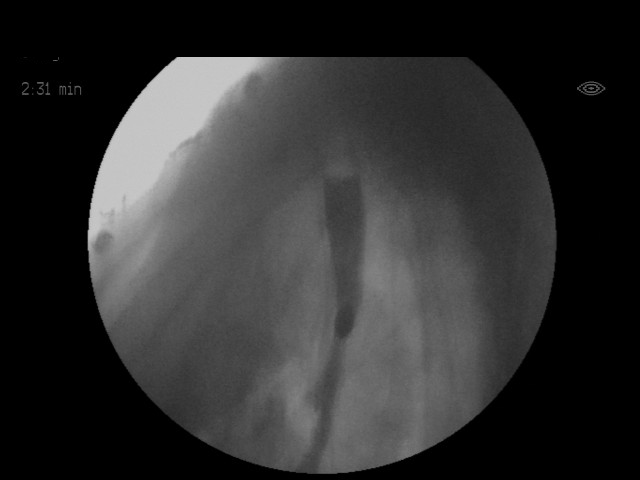
[im 27/27]
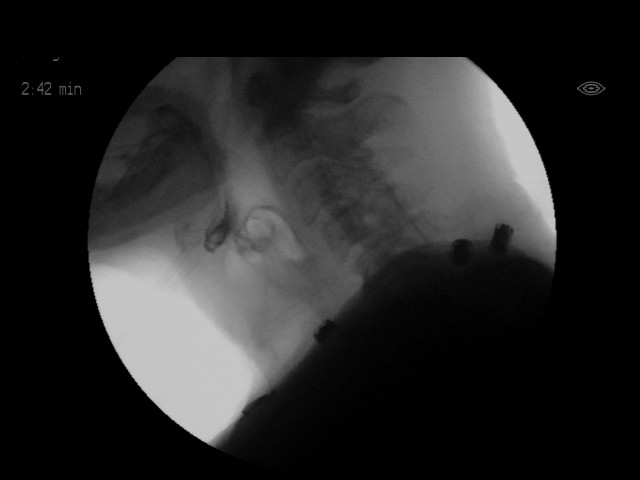

[18 of 24 positions shown; findings below may reference images not displayed]

FLUOROSCOPY FOR SWALLOWING FUNCTION STUDY:
Fluoroscopy was provided for swallowing function study, which was administered by a speech pathologist.  Final results and recommendations from this study are contained within the speech pathology report.
